# Patient Record
Sex: Female | Born: 1965 | Hispanic: No | Marital: Single | Smoking: Former smoker
Health system: Southern US, Community
[De-identification: ages and names within clinical notes are randomized; demographics above are authoritative.]

## PROBLEM LIST (undated history)

## (undated) DIAGNOSIS — E119 Type 2 diabetes mellitus without complications: Secondary | ICD-10-CM

## (undated) DIAGNOSIS — J40 Bronchitis, not specified as acute or chronic: Secondary | ICD-10-CM

## (undated) HISTORY — PX: ABDOMINAL SURGERY: SHX537

## (undated) HISTORY — PX: APPENDECTOMY: SHX54

## (undated) HISTORY — PX: TUBAL LIGATION: SHX77

---

## 2003-09-18 ENCOUNTER — Emergency Department (HOSPITAL_COMMUNITY): Admission: EM | Admit: 2003-09-18 | Discharge: 2003-09-18 | Payer: Self-pay

## 2009-02-24 ENCOUNTER — Emergency Department (HOSPITAL_COMMUNITY): Admission: EM | Admit: 2009-02-24 | Discharge: 2009-02-25 | Payer: Self-pay | Admitting: Emergency Medicine

## 2009-02-24 ENCOUNTER — Emergency Department (HOSPITAL_COMMUNITY): Admission: EM | Admit: 2009-02-24 | Discharge: 2009-02-24 | Payer: Self-pay | Admitting: Emergency Medicine

## 2010-11-15 LAB — DIFFERENTIAL
Basophils Absolute: 0 10*3/uL (ref 0.0–0.1)
Basophils Relative: 0 % (ref 0–1)
Monocytes Absolute: 0.6 10*3/uL (ref 0.1–1.0)
Neutro Abs: 6.7 10*3/uL (ref 1.7–7.7)
Neutrophils Relative %: 62 % (ref 43–77)

## 2010-11-15 LAB — URINE CULTURE

## 2010-11-15 LAB — URINALYSIS, ROUTINE W REFLEX MICROSCOPIC
Bilirubin Urine: NEGATIVE
Ketones, ur: NEGATIVE mg/dL
Nitrite: NEGATIVE
Urobilinogen, UA: 1 mg/dL (ref 0.0–1.0)

## 2010-11-15 LAB — COMPREHENSIVE METABOLIC PANEL
Albumin: 4 g/dL (ref 3.5–5.2)
Alkaline Phosphatase: 76 U/L (ref 39–117)
BUN: 12 mg/dL (ref 6–23)
CO2: 27 mEq/L (ref 19–32)
Chloride: 106 mEq/L (ref 96–112)
Glucose, Bld: 94 mg/dL (ref 70–99)
Potassium: 3.6 mEq/L (ref 3.5–5.1)
Total Bilirubin: 0.4 mg/dL (ref 0.3–1.2)

## 2010-11-15 LAB — CBC
HCT: 30.3 % — ABNORMAL LOW (ref 36.0–46.0)
Hemoglobin: 10 g/dL — ABNORMAL LOW (ref 12.0–15.0)
RBC: 3.86 MIL/uL — ABNORMAL LOW (ref 3.87–5.11)
WBC: 10.8 10*3/uL — ABNORMAL HIGH (ref 4.0–10.5)

## 2010-11-15 LAB — POCT URINALYSIS DIP (DEVICE)
Bilirubin Urine: NEGATIVE
Glucose, UA: NEGATIVE mg/dL
Hgb urine dipstick: NEGATIVE
Specific Gravity, Urine: >=1.03 (ref 1.005–1.030)
Urobilinogen, UA: 0.2 mg/dL (ref 0.0–1.0)
pH: 5.5 (ref 5.0–8.0)

## 2010-11-15 LAB — WET PREP, GENITAL: Trich, Wet Prep: NONE SEEN

## 2010-11-15 LAB — GC/CHLAMYDIA PROBE AMP, GENITAL: Chlamydia, DNA Probe: NEGATIVE

## 2010-11-15 LAB — PREGNANCY, URINE: Preg Test, Ur: NEGATIVE

## 2010-12-22 NOTE — Consult Note (Signed)
Christina Horton, GALDAMEZ NO.:  [id number]   MEDICAL RECORD NO.:  [id number]          PATIENT TYPE:  EMS   LOCATION:  [facility]                         FACILITY:  [facility]   PHYSICIAN:  Juanetta Gosling, MDDATE OF BIRTH:  08/21/1965   DATE OF CONSULTATION:  DATE OF DISCHARGE:  02/25/2009                                 CONSULTATION   CHIEF COMPLAINT:  Abdominal pain.   CONSULTING PHYSICIAN:  April Palumbo-Rasch, MD.   HISTORY OF PRESENT ILLNESS:  This is a 45 year old female with over 36-  year history of mid right-sided abdominal pain that began with an  insidious onset.  It has worsened over that time.  She came in tonight  just because it had been getting worse.  She denies any fevers.  Denies  nausea, vomiting.  She has had normal bowel movement.  She says she has  been eating today.  There is no real relieving or aggravating factors  that she can associate with this pain.   PAST SURGICAL HISTORY:  Includes an appendectomy, abdominoplasty and  bilateral tubal ligation.   PAST MEDICAL HISTORY:  Negative.   MEDICATIONS:  None.   ALLERGIES:  She has no known drug allergies.   SOCIAL HISTORY:  She is a smoker and does drink occasional alcohol.   REVIEW OF SYSTEMS:  Otherwise negative.   PHYSICAL EXAMINATION:  VITAL SIGNS:  Temperature 97.1, pulse 73, blood  pressure 132/84, respirations 16, O2 sats 100%.  GENERAL:  She is an obese female in no apparent distress.  HEART:  Regular rate and rhythm.  LUNGS:  Clear bilaterally.  ABDOMEN:  Soft, obese.  She has a transverse scar present.  She has some  tenderness to palpation on her right mid abdomen, but no peritoneal  signs.  Bowel sounds are present.  EXTREMITIES:  No edema.   LABORATORY EVALUATION:  White blood cell count of 10.8 with normal  neutrophil count, hematocrit of 30.3 and platelets of 376.  Urinalysis  shows large leukocyte esterase and 21-50 white blood cells.  Her sodium  is 140, potassium 3.6,  glucose 94, BUN 12, creatinine 0.70.  Liver  function test are otherwise normal.  Lipase is 24.  CT scan of her  abdomen and pelvis shows a focal inflammatory process, infiltrative  change laterally, ascending colon appears to be in the omentum or  mesenteric fat.  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   PLAN:  There was no evidence that she has any need for surgical  treatment of her abdomen currently.  I do not think that she can  probably be discharged home with some warning that I gave her with some  close followup with her primary care physician.  I did talk to her  extensively about following up in case of any worrisome symptoms  including fevers, nausea, vomiting, worsening pain and inability to pass  gas or have any bowel movements.  She understands this, and I think she  would be reliable.  Dr. Nicanor Alcon is going to treat her, also for a  urinary tract infection which she apparently has on top of all this as  well.      Juanetta Gosling, MD  Electronically Signed     MCW/MEDQ  D:  02/24/2009  T:  02/25/2009  Job:  [id number]

## 2012-02-18 ENCOUNTER — Emergency Department (HOSPITAL_COMMUNITY)
Admission: EM | Admit: 2012-02-18 | Discharge: 2012-02-18 | Disposition: A | Discharge status: Home / Self Care | Payer: BC Managed Care – PPO | Attending: Emergency Medicine | Admitting: Emergency Medicine

## 2012-02-18 ENCOUNTER — Encounter (HOSPITAL_COMMUNITY): Payer: Self-pay | Admitting: *Deleted

## 2012-02-18 DIAGNOSIS — B9789 Other viral agents as the cause of diseases classified elsewhere: Secondary | ICD-10-CM | POA: Insufficient documentation

## 2012-02-18 DIAGNOSIS — R599 Enlarged lymph nodes, unspecified: Secondary | ICD-10-CM | POA: Insufficient documentation

## 2012-02-18 DIAGNOSIS — J029 Acute pharyngitis, unspecified: Secondary | ICD-10-CM | POA: Insufficient documentation

## 2012-02-18 DIAGNOSIS — F17200 Nicotine dependence, unspecified, uncomplicated: Secondary | ICD-10-CM | POA: Insufficient documentation

## 2012-02-18 MED ORDER — HYDROCODONE-ACETAMINOPHEN 5-325 MG PO TABS: 1.0000 | ORAL_TABLET | Freq: Four times a day (QID) | ORAL | Status: AC | PRN

## 2012-02-18 NOTE — ED Notes (Signed)
Pt reports symptoms started on July 5th with cough and URI.  Pt reports she has a history of bronchitis and stopped smoking, but smoked cigarettes on the 4th and she believes this may have started her symptoms.  Pt reports cough has improved, but now has a sore throat.

## 2012-02-18 NOTE — ED Provider Notes (Signed)
History     CSN: [id number]  Arrival date & time 02/18/12  1012   First MD Initiated Contact with Patient 02/18/12 1030      Chief Complaint  Patient presents with  . Sore Throat    (Consider location/radiation/quality/duration/timing/severity/associated sxs/prior treatment) HPI Comments: Patient with a significant past medical history presents emergency department with chief complaint of sore throat.  Onset of symptoms began last night and are described as pain with swallowing.  Patient denies any inability to swallow secretions or food.  In addition patient reports that she had a nonproductive cough back on July 5 and smoked some cigarettes that weekend as well.  She denies fevers, night sweats, chills, current cough, hemoptysis, congestion.  No other complaints at this time.  The history is provided by the patient.    History reviewed. No pertinent past medical history.  Past Surgical History  Procedure Date  . Abdominal surgery   . Tubal ligation     No family history on file.  History  Substance Use Topics  . Smoking status: Current Some Day Smoker  . Smokeless tobacco: Not on file  . Alcohol Use: Yes     socially    OB History    Grav Para Term Preterm Abortions TAB SAB Ect Mult Living                  Review of Systems  Constitutional: Negative for fever, chills, diaphoresis, activity change, appetite change and fatigue.  HENT: Positive for sore throat. Negative for congestion, facial swelling, rhinorrhea, sneezing, drooling, trouble swallowing, neck pain, neck stiffness, dental problem, voice change, postnasal drip and sinus pressure.   Eyes: Negative for visual disturbance.  Respiratory: Negative for cough, choking, shortness of breath, wheezing and stridor.   Cardiovascular: Negative for chest pain.  Gastrointestinal: Negative for abdominal pain.  Skin: Negative for rash.  Neurological: Negative for dizziness and headaches.  Hematological: Negative for  adenopathy. Does not bruise/bleed easily.    Allergies  Review of patient's allergies indicates no known allergies.  Home Medications  No current outpatient prescriptions on file.  BP 144/88  Pulse 90  Temp 98.4 F (36.9 C) (Oral)  Resp 18  SpO2 97%  LMP 01/30/2012  Physical Exam  Nursing note and vitals reviewed. Constitutional: She is oriented to person, place, and time. She appears well-developed and well-nourished. No distress.  HENT:  Head: Normocephalic and atraumatic. No trismus in the jaw.  Right Ear: Tympanic membrane, external ear and ear canal normal.  Left Ear: Tympanic membrane, external ear and ear canal normal.  Nose: Nose normal. No rhinorrhea. Right sinus exhibits no maxillary sinus tenderness and no frontal sinus tenderness. Left sinus exhibits no maxillary sinus tenderness and no frontal sinus tenderness.  Mouth/Throat: Uvula is midline and mucous membranes are normal. Normal dentition. No dental abscesses or uvula swelling. No oropharyngeal exudate, posterior oropharyngeal edema, posterior oropharyngeal erythema or tonsillar abscesses.       No submental edema, tongue not elevated, no trismus. No impending airway obstruction; Pt able to speak full sentences, swallow intact, no drooling, stridor, or tonsillar/uvula displacement. No palatal petechia  Eyes: Conjunctivae are normal.  Neck: Trachea normal, normal range of motion and full passive range of motion without pain. Neck supple. No rigidity. Erythema present. Normal range of motion present. No Brudzinski's sign noted.       Flexion and extension of neck without pain or difficulty. Able to breath without difficulty in extension.  Cardiovascular: Normal rate and  regular rhythm.   Pulmonary/Chest: Effort normal and breath sounds normal. No stridor. No respiratory distress. She has no wheezes.  Abdominal: Soft. There is no tenderness.       No obvious evidence of splenomegaly. Non ttp.   Musculoskeletal: Normal  range of motion.  Lymphadenopathy:       Head (right side): No preauricular and no posterior auricular adenopathy present.       Head (left side): No preauricular and no posterior auricular adenopathy present.    She has cervical adenopathy.  Neurological: She is alert and oriented to person, place, and time.  Skin: Skin is warm and dry. No rash noted. She is not diaphoretic.  Psychiatric: She has a normal mood and affect.    ED Course  Procedures (including critical care time)   Labs Reviewed  RAPID STREP SCREEN   No results found.   No diagnosis found.    MDM  Pt afebrile without tonsillar exudate, negative strep. Presents with mild cervical lymphadenopathy, & dysphagia; diagnosis of viral pharyngitis. No abx indicated. DC w symptomatic tx for pain  Pt does not appear dehydrated, but did discuss importance of water rehydration. Presentation non concerning for PTA or infxn spread to soft tissue. No trismus or uvula deviation. Specific return precautions discussed. Pt able to drink water in ED without difficulty with intact air way. Recommended PCP follow up.            Jaci Carrel, [place] 02/18/12 1114

## 2012-02-20 NOTE — ED Provider Notes (Signed)
Medical screening examination/treatment/procedure(s) were performed by non-physician practitioner and as supervising physician I was immediately available for consultation/collaboration.  Toy Baker, MD 02/20/12 [phone]

## 2012-05-10 ENCOUNTER — Emergency Department (INDEPENDENT_AMBULATORY_CARE_PROVIDER_SITE_OTHER): Payer: BC Managed Care – PPO

## 2012-05-10 ENCOUNTER — Emergency Department (HOSPITAL_COMMUNITY)
Admission: EM | Admit: 2012-05-10 | Discharge: 2012-05-10 | Disposition: A | Discharge status: Home / Self Care | Payer: BC Managed Care – PPO | Source: Home / Self Care | Attending: Emergency Medicine | Admitting: Emergency Medicine

## 2012-05-10 ENCOUNTER — Encounter (HOSPITAL_COMMUNITY): Payer: Self-pay | Admitting: *Deleted

## 2012-05-10 DIAGNOSIS — IMO0002 Reserved for concepts with insufficient information to code with codable children: Secondary | ICD-10-CM

## 2012-05-10 DIAGNOSIS — M5416 Radiculopathy, lumbar region: Secondary | ICD-10-CM

## 2012-05-10 LAB — POCT URINALYSIS DIP (DEVICE)
Bilirubin Urine: NEGATIVE
Ketones, ur: NEGATIVE mg/dL
Leukocytes, UA: NEGATIVE
pH: 6 (ref 5.0–8.0)

## 2012-05-10 LAB — POCT PREGNANCY, URINE: Preg Test, Ur: NEGATIVE

## 2012-05-10 MED ORDER — CYCLOBENZAPRINE HCL 10 MG PO TABS: 10.0000 mg | ORAL_TABLET | Freq: Two times a day (BID) | ORAL | Status: AC | PRN

## 2012-05-10 MED ORDER — HYDROCODONE-IBUPROFEN 7.5-200 MG PO TABS: 1.0000 | ORAL_TABLET | Freq: Three times a day (TID) | ORAL | Status: AC | PRN

## 2012-05-10 NOTE — ED Provider Notes (Signed)
History     CSN: [id number]  Arrival date & time 05/10/12  1204   First MD Initiated Contact with Patient 05/10/12 1218      Chief Complaint  Patient presents with  . Flank Pain    (Consider location/radiation/quality/duration/timing/severity/associated sxs/prior treatment) HPI Comments: Patient reports that for about 9 days she's been having left-sided lower back pain that radiates down toward the interior as of her left leg. Describes that the day prior she was lifting weights in a gym and did feel that the pain started today after. Denies any specific injuries or falls does to relate that all started after that particular exercise session. Describes the pain starts in the lower left side of her back that radiates towards the anterior aspect of her thigh to the anterior aspect of her thigh. Feels some occasional tingling sensation in the anterior aspect of her left leg. Denies any weakness of her lower extremity, any urinary symptoms such as increased frequency, loss of control or burning. Patient also describes no changes in her bowel habits. He denies any constitutional symptoms such as fevers, malaise, or unintentional weight loss.  Patient is a 46 y.o. female presenting with flank pain. The history is provided by the patient.  Flank Pain This is a new problem. The current episode started more than 1 week ago. The problem occurs constantly. The problem has been gradually worsening. Pertinent negatives include no abdominal pain. The symptoms are aggravated by walking, twisting and bending. The symptoms are relieved by NSAIDs, lying down, heat and ice. She has tried a warm compress, acetaminophen, rest and a cold compress for the symptoms. The treatment provided mild relief.    History reviewed. No pertinent past medical history.  Past Surgical History  Procedure Date  . Abdominal surgery   . Tubal ligation     No family history on file.  History  Substance Use Topics  . Smoking  status: Current Some Day Smoker  . Smokeless tobacco: Not on file  . Alcohol Use: Yes     socially    OB History    Grav Para Term Preterm Abortions TAB SAB Ect Mult Living                  Review of Systems  Constitutional: Positive for activity change. Negative for fever, chills, appetite change, fatigue and unexpected weight change.  Cardiovascular: Negative for leg swelling.  Gastrointestinal: Negative for abdominal pain.  Genitourinary: Positive for flank pain. Negative for dysuria, urgency, frequency, hematuria, decreased urine volume, difficulty urinating and dyspareunia.  Musculoskeletal: Positive for back pain and joint swelling. Negative for arthralgias.  Skin: Negative for rash.  Neurological: Positive for numbness. Negative for weakness.    Allergies  Review of patient's allergies indicates no known allergies.  Home Medications   Current Outpatient Rx  Name Route Sig Dispense Refill  . CYCLOBENZAPRINE HCL 10 MG PO TABS Oral Take 1 tablet (10 mg total) by mouth 2 (two) times daily as needed for muscle spasms. 20 tablet 0  . HYDROCODONE-IBUPROFEN 7.5-200 MG PO TABS Oral Take 1 tablet by mouth every 8 (eight) hours as needed for pain. 30 tablet 0    BP 139/100  Pulse 96  Temp 98 F (36.7 C) (Oral)  Resp 20  SpO2 99%  LMP 05/01/2012  Physical Exam  Nursing note and vitals reviewed. Constitutional: She appears well-developed and well-nourished.  Cardiovascular: Normal rate.  Exam reveals no gallop and no friction rub.   No murmur heard.  Abdominal: Soft.  Musculoskeletal: She exhibits tenderness.       Lumbar back: She exhibits decreased range of motion, tenderness, bony tenderness and pain. She exhibits no swelling, no laceration, no spasm and normal pulse.       Back:  Neurological: She is alert. No cranial nerve deficit.  Skin: Skin is warm. No erythema.    ED Course  Procedures (including critical care time)   Labs Reviewed  POCT URINALYSIS DIP  (DEVICE)  POCT PREGNANCY, URINE   Dg Lumbar Spine Complete  05/10/2012  *RADIOLOGY REPORT*  Clinical Data: Injured lifting weights, low back pain  LUMBAR SPINE - COMPLETE 4+ VIEW  Comparison: CT abdomen of 02/24/2009  Findings: The lumbar vertebrae are in normal alignment.  There is slight loss of disc space at L5-S1 with minimal spurring.  No fracture is seen.  The remainder of intervertebral disc spaces appear normal.  The SI joints are well corticated.  IMPRESSION: Normal alignment.  Very mild degenerative disc disease at L5-S1.   Original Report Authenticated By: Juline Patch, M.D.      1. Lumbar radiculopathy       MDM  Patient has been symptomatic for approximately 10 days. Vicoprofen prescribed along with flexor. Encouraged to followup with orthopedic Dr. Stanford Breed the noted mild DJD at L5-S1. Exam was not revealing for a muscular deficit. Patient was encouraged to followup with orthopedic doctor pain was to persist after 1-2 weeks to be consider physical therapy or further evaluation. She agrees to pursue further evaluation if symptoms were to persist. Patient was also encouraged not to use over-the-counter medicines while taking this medicines. Otherwise about potential side effects including drowsiness and dizziness. She understands and agrees with treatment plan and followup care as necessary.      Jimmie Molly, MD 05/10/12 [phone]

## 2012-05-10 NOTE — ED Notes (Signed)
Pt  Reports       l  Side  Pain  Flank  Area   With  Radiation  Down l   Leg   X  9  Days   Se[facility]  [facility]    Was  rx  Flexeril  / pain pill  And  Given  steriod  Shot      -  Pt  Reports  Still  Having  Symptoms

## 2012-10-26 ENCOUNTER — Ambulatory Visit (INDEPENDENT_AMBULATORY_CARE_PROVIDER_SITE_OTHER): Payer: BC Managed Care – PPO | Admitting: Family Medicine

## 2012-10-26 ENCOUNTER — Ambulatory Visit: Payer: BC Managed Care – PPO

## 2012-10-26 VITALS — BP 132/88 | HR 100 | Temp 98.5°F | Resp 16 | Ht 62.0 in | Wt 246.0 lb

## 2012-10-26 DIAGNOSIS — M722 Plantar fascial fibromatosis: Secondary | ICD-10-CM

## 2012-10-26 DIAGNOSIS — M7750 Other enthesopathy of unspecified foot: Secondary | ICD-10-CM

## 2012-10-26 DIAGNOSIS — M79609 Pain in unspecified limb: Secondary | ICD-10-CM

## 2012-10-26 DIAGNOSIS — M659 Synovitis and tenosynovitis, unspecified: Secondary | ICD-10-CM

## 2012-10-26 DIAGNOSIS — M79671 Pain in right foot: Secondary | ICD-10-CM

## 2012-10-26 MED ORDER — TRAMADOL HCL 50 MG PO TABS: ORAL_TABLET | ORAL | Status: DC

## 2012-10-26 MED ORDER — DICLOFENAC SODIUM 75 MG PO TBEC: DELAYED_RELEASE_TABLET | ORAL | Status: DC

## 2012-10-26 NOTE — Patient Instructions (Addendum)
Diclofenac 75 mg one twice daily  Tramadol 50 mg every 6 hours as needed for severe pain  Wear splint  Use crutches  Ice the foot about 5 or 6 times daily  Return on Tuesday the 25th between 8 and 12 AM

## 2012-10-26 NOTE — Progress Notes (Signed)
Subjective: 47 year old lady who has been having progressive worsening of right heel pain over the past month or so. Knows of no specific injuries. 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 She wears comfortable shoes, and changes them often. The pain is in the medial aspect of the right foot. She says it is like it's going to cramp up into the calf of the legs. The pain hurts just anterior to the medial malleolus and on down to the anterior aspect of the calcaneus.  She is generally a healthy lady. Last menstrual cycle was started and she is pregnancy at this time.  Objective: No obvious swelling of her foot or ankle. She has tenderness in the right ankle down the medial aspect following a path running just anterior to the medial malleolus, all the way around to under the foot at the anterior aspect of the calcaneus. Flexion and extension of the ankle did not cause a great deal of pain. Eversion of the ankle is very painful. Inversion less. Point tenderness along the whole band, getting worse from below the malleolus to be anterior calcaneus area. There is no erythema. She can move her toes fine. Pulses are good.  Assessment: Ankle and foot pain, suspicious for tendinitis/plantar fasciitis  Plan: Get x-ray of her ankle and proceed from there  [facility] reading (PRIMARY) by  Dr. Alwyn Ren No fracture  Assessment: Treat with inflammatory medications and pain medication, as well as splint.  Use crutches when up and about. Minimize going up and downstairs. He has been advised to keep on hurting for a long time, so I'm going to leave her off work for 5 days See her back on Tuesday. Diclofenac and tramadol.  2

## 2012-10-31 ENCOUNTER — Ambulatory Visit (INDEPENDENT_AMBULATORY_CARE_PROVIDER_SITE_OTHER): Payer: BC Managed Care – PPO | Admitting: Family Medicine

## 2012-10-31 VITALS — BP 134/84 | HR 98 | Temp 98.5°F | Resp 18 | Ht 62.0 in | Wt 247.0 lb

## 2012-10-31 DIAGNOSIS — M659 Synovitis and tenosynovitis, unspecified: Secondary | ICD-10-CM

## 2012-10-31 DIAGNOSIS — M722 Plantar fascial fibromatosis: Secondary | ICD-10-CM

## 2012-10-31 DIAGNOSIS — M7750 Other enthesopathy of unspecified foot: Secondary | ICD-10-CM

## 2012-10-31 DIAGNOSIS — Z0271 Encounter for disability determination: Secondary | ICD-10-CM

## 2012-10-31 DIAGNOSIS — M79671 Pain in right foot: Secondary | ICD-10-CM

## 2012-10-31 MED ORDER — DICLOFENAC SODIUM 75 MG PO TBEC: DELAYED_RELEASE_TABLET | ORAL | Status: DC

## 2012-10-31 MED ORDER — TRAMADOL HCL 50 MG PO TABS: ORAL_TABLET | ORAL | Status: DC

## 2012-10-31 NOTE — Patient Instructions (Signed)
Wear the Cam Walker.  Continue to try and limit use of the foot.  Bring by the [facility] papers  We will make a referral for further evaluation of the foot.

## 2012-10-31 NOTE — Progress Notes (Signed)
Subjective: Patient is here for recheck of her foot. It continues to hurt. She has been trying to rest it very strictly for the last 5 days. She works a job where she has to stand for about 10 hours. She says there is no light duty alternative. She walks back and forth all day. She has been wearing the splint though it causes her some ankle discomfort.  Objective: Pleasant lady who continues to have foot discomfort. Still quite tender over the medial tendon insertions to the lateral aspect of the calcaneus, as well as in the anterior calcaneal region.  Assessment: Plantar fasciitis Tendinitis right foot  Plan: Continue current medications Try a Cam Walker as well as using the crutches as needed.  Make referral to sports medicine, Dr. Karel Jarvis, for further evaluation and treatment.  The patient will bring by her FMLA papers.

## 2013-01-17 ENCOUNTER — Emergency Department (HOSPITAL_COMMUNITY)
Admission: EM | Admit: 2013-01-17 | Discharge: 2013-01-17 | Disposition: A | Discharge status: Home / Self Care | Payer: BC Managed Care – PPO | Source: Home / Self Care

## 2013-01-17 ENCOUNTER — Encounter (HOSPITAL_COMMUNITY): Payer: Self-pay | Admitting: Emergency Medicine

## 2013-01-17 DIAGNOSIS — R1011 Right upper quadrant pain: Secondary | ICD-10-CM

## 2013-01-17 LAB — CBC WITH DIFFERENTIAL/PLATELET
Basophils Absolute: 0 10*3/uL (ref 0.0–0.1)
Basophils Relative: 0 % (ref 0–1)
Eosinophils Absolute: 0.2 10*3/uL (ref 0.0–0.7)
Eosinophils Relative: 2 % (ref 0–5)
MCH: 27.8 pg (ref 26.0–34.0)
MCV: 84.3 fL (ref 78.0–100.0)
Neutrophils Relative %: 68 % (ref 43–77)
Platelets: 307 10*3/uL (ref 150–400)
RBC: 4.28 MIL/uL (ref 3.87–5.11)
RDW: 14 % (ref 11.5–15.5)

## 2013-01-17 LAB — COMPREHENSIVE METABOLIC PANEL
ALT: 95 U/L — ABNORMAL HIGH (ref 0–35)
AST: 86 U/L — ABNORMAL HIGH (ref 0–37)
Albumin: 3.6 g/dL (ref 3.5–5.2)
Alkaline Phosphatase: 103 U/L (ref 39–117)
Calcium: 9.1 mg/dL (ref 8.4–10.5)
GFR calc Af Amer: >90 mL/min (ref >90)
Potassium: 4 mEq/L (ref 3.5–5.1)
Sodium: 139 mEq/L (ref 135–145)
Total Protein: 7.4 g/dL (ref 6.0–8.3)

## 2013-01-17 MED ORDER — OMEPRAZOLE 40 MG PO CPDR: 40.0000 mg | DELAYED_RELEASE_CAPSULE | Freq: Every day | ORAL | Status: DC

## 2013-01-17 NOTE — ED Notes (Signed)
Pt c/o intermittent RUQ pain onset 4 months\ Reports its gradually swelling... Pain increases w/alcahol, food intake and when pressure is applied to it Denies: f/v/n/d, constipation... She is alert and oriented w/no signs of acute distress.

## 2013-01-17 NOTE — ED Provider Notes (Signed)
History     CSN: [id number]  Arrival date & time 01/17/13  1124   First MD Initiated Contact with Patient 01/17/13 1204      Chief Complaint  Patient presents with  . Abdominal Pain    (Consider location/radiation/quality/duration/timing/severity/associated sxs/prior treatment) Patient is a 47 y.o. female presenting with abdominal pain.  Abdominal Pain Associated symptoms include abdominal pain.   This is a 47 year old female who presents with a complaint of right upper quadrant "burning" pain often related to eating and drinking. She states that it has been going on for about 4 months now and usually lasts for no more than a few hours after she eats. This is usually associated with nausea but no vomiting. She has not had any weight loss. She has not had any fevers. Pain is mostly in right upper quadrant and not at all in the epigastrium. No history of reflux. History reviewed. No pertinent past medical history.  Past Surgical History  Procedure Laterality Date  . Abdominal surgery    . Tubal ligation    . Appendectomy      Family History  Problem Relation Age of Onset  . Cancer Sister     History  Substance Use Topics  . Smoking status: Current Some Day Smoker  . Smokeless tobacco: Not on file  . Alcohol Use: Yes     Comment: socially    OB History   Grav Para Term Preterm Abortions TAB SAB Ect Mult Living                  Review of Systems  Constitutional: Negative.  Negative for fever and appetite change.  HENT: Negative.   Eyes: Negative.   Respiratory: Negative.   Cardiovascular: Negative.   Gastrointestinal: Positive for nausea, abdominal pain and abdominal distention. Negative for vomiting, constipation, blood in stool and anal bleeding.  Genitourinary: Negative.   Skin: Negative.   Neurological: Negative.   Hematological: Negative.   Psychiatric/Behavioral: Negative.     Allergies  Review of patient's allergies indicates no known  allergies.  Home Medications   Current Outpatient Rx  Name  Route  Sig  Dispense  Refill  . diclofenac (VOLTAREN) 75 MG EC tablet      Take one twice daily with food for inflammation and pain. Take on regular basis.   30 tablet   0   . omeprazole (PRILOSEC) 40 MG capsule   Oral   Take 1 capsule (40 mg total) by mouth daily.   30 capsule   0   . traMADol (ULTRAM) 50 MG tablet      Take one every 6 hours as needed for severe pain only   15 tablet   0     BP 107/69  Pulse 86  Temp(Src) 98.5 F (36.9 C) (Oral)  Resp 16  SpO2 98%  LMP 01/07/2013  Physical Exam  Constitutional: She is oriented to person, place, and time. She appears well-developed and well-nourished.  HENT:  Head: Normocephalic and atraumatic.  Eyes: Conjunctivae are normal. Pupils are equal, round, and reactive to light.  Neck: Normal range of motion. Neck supple.  Cardiovascular: Normal rate and regular rhythm.   Pulmonary/Chest: Effort normal and breath sounds normal.  Abdominal: Soft. Bowel sounds are normal. She exhibits no distension and no mass. There is tenderness. There is no rebound and no guarding.  Musculoskeletal: Normal range of motion.  Neurological: She is alert and oriented to person, place, and time.  Skin: Skin is warm  and dry.  Psychiatric: She has a normal mood and affect. Her behavior is normal.    ED Course  Procedures (including critical care time)  Labs Reviewed - No data to display No results found.   1. RUQ abdominal pain       MDM  Ultrasound of liver/gallblader and CBC/Cmet ordered- she can follow up here or with her PCP for results. In case this is simply a gastritis, I will start treatment with Omeprazole 40 mg daily.         Calvert Cantor, MD 01/17/13 1242

## 2013-01-17 NOTE — ED Notes (Signed)
Pt has been scheduled appt for [place] of abdomen on 01/22/13 at 0745 for check in Also, Rx w/lab orders has been given to pt along w/US orders .... Pt has verbalized understanding

## 2013-01-22 ENCOUNTER — Ambulatory Visit (HOSPITAL_COMMUNITY)
Admit: 2013-01-22 | Discharge: 2013-01-22 | Disposition: A | Discharge status: Home / Self Care | Payer: BC Managed Care – PPO | Attending: Internal Medicine | Admitting: Internal Medicine

## 2013-01-22 ENCOUNTER — Other Ambulatory Visit (HOSPITAL_COMMUNITY): Payer: Self-pay | Admitting: Internal Medicine

## 2013-01-22 DIAGNOSIS — R109 Unspecified abdominal pain: Secondary | ICD-10-CM

## 2013-01-22 DIAGNOSIS — R1011 Right upper quadrant pain: Secondary | ICD-10-CM | POA: Insufficient documentation

## 2013-01-29 ENCOUNTER — Emergency Department (HOSPITAL_COMMUNITY): Payer: BC Managed Care – PPO

## 2013-01-29 ENCOUNTER — Encounter (HOSPITAL_COMMUNITY): Payer: Self-pay | Admitting: *Deleted

## 2013-01-29 ENCOUNTER — Emergency Department (HOSPITAL_COMMUNITY)
Admission: EM | Admit: 2013-01-29 | Discharge: 2013-01-29 | Disposition: A | Discharge status: Home / Self Care | Payer: BC Managed Care – PPO | Attending: Emergency Medicine | Admitting: Emergency Medicine

## 2013-01-29 DIAGNOSIS — R109 Unspecified abdominal pain: Secondary | ICD-10-CM

## 2013-01-29 DIAGNOSIS — K6389 Other specified diseases of intestine: Secondary | ICD-10-CM | POA: Insufficient documentation

## 2013-01-29 DIAGNOSIS — Z87891 Personal history of nicotine dependence: Secondary | ICD-10-CM | POA: Insufficient documentation

## 2013-01-29 LAB — LIPASE, BLOOD: Lipase: 33 U/L (ref 11–59)

## 2013-01-29 LAB — URINALYSIS, ROUTINE W REFLEX MICROSCOPIC
Glucose, UA: NEGATIVE mg/dL
Hgb urine dipstick: NEGATIVE
Protein, ur: NEGATIVE mg/dL
pH: 6 (ref 5.0–8.0)

## 2013-01-29 LAB — COMPREHENSIVE METABOLIC PANEL
ALT: 55 U/L — ABNORMAL HIGH (ref 0–35)
Alkaline Phosphatase: 99 U/L (ref 39–117)
BUN: 9 mg/dL (ref 6–23)
CO2: 27 mEq/L (ref 19–32)
Chloride: 102 mEq/L (ref 96–112)
GFR calc Af Amer: >90 mL/min (ref >90)
Glucose, Bld: 155 mg/dL — ABNORMAL HIGH (ref 70–99)
Potassium: 3.9 mEq/L (ref 3.5–5.1)
Sodium: 136 mEq/L (ref 135–145)
Total Bilirubin: 0.3 mg/dL (ref 0.3–1.2)

## 2013-01-29 LAB — POCT PREGNANCY, URINE: Preg Test, Ur: NEGATIVE

## 2013-01-29 LAB — URINE MICROSCOPIC-ADD ON

## 2013-01-29 LAB — CBC
HCT: 38.5 % (ref 36.0–46.0)
Hemoglobin: 12.6 g/dL (ref 12.0–15.0)
RBC: 4.57 MIL/uL (ref 3.87–5.11)
WBC: 8.5 10*3/uL (ref 4.0–10.5)

## 2013-01-29 MED ORDER — IBUPROFEN 600 MG PO TABS: 600.0000 mg | ORAL_TABLET | Freq: Three times a day (TID) | ORAL | Status: DC | PRN

## 2013-01-29 MED ORDER — IOHEXOL 300 MG/ML  SOLN
25.0000 mL | INTRAMUSCULAR | Status: AC
  Administered 2013-01-29: 13:00:00 via ORAL

## 2013-01-29 MED ORDER — IBUPROFEN 400 MG PO TABS
400.0000 mg | ORAL_TABLET | Freq: Once | ORAL | Status: AC
  Administered 2013-01-29: 400 mg via ORAL
  Filled 2013-01-29: qty 1

## 2013-01-29 MED ORDER — IOHEXOL 300 MG/ML  SOLN
100.0000 mL | Freq: Once | INTRAMUSCULAR | Status: AC | PRN
  Administered 2013-01-29: 100 mL via INTRAVENOUS

## 2013-01-29 NOTE — ED Notes (Signed)
Pain with urination

## 2013-01-29 NOTE — ED Provider Notes (Signed)
History     CSN: [id number]  Arrival date & time 01/29/13  1031   First MD Initiated Contact with Patient 01/29/13 1220      Chief Complaint  Patient presents with  . Abdominal Pain    (Consider location/radiation/quality/duration/timing/severity/associated sxs/prior treatment) Patient is a 47 y.o. female presenting with abdominal pain. The history is provided by the patient.  Abdominal Pain Associated symptoms include abdominal pain. Pertinent negatives include no chest pain, no headaches and no shortness of breath.  pt c/o lower abdominal pain for the past couple days. Gradual onset. Constant. Dull. Non radiating. Moderate. Denies specific exacerbating or alleviating factors. No associated nvd, fever or chills. No dysuria or gu c/o. No back or flank pain. Prior abd surgery includes tubal ligation and appendectomy. No hx pud or gallstones. lnmp 2 weeks ago, norma.no denies vaginal discharge or bleeding.     History reviewed. No pertinent past medical history.  Past Surgical History  Procedure Laterality Date  . Abdominal surgery    . Tubal ligation    . Appendectomy      Family History  Problem Relation Age of Onset  . Cancer Sister     History  Substance Use Topics  . Smoking status: Former Games developer  . Smokeless tobacco: Not on file  . Alcohol Use: Yes     Comment: socially    OB History   Grav Para Term Preterm Abortions TAB SAB Ect Mult Living                  Review of Systems  Constitutional: Negative for fever and chills.  HENT: Negative for neck pain.   Eyes: Negative for redness.  Respiratory: Negative for shortness of breath.   Cardiovascular: Negative for chest pain.  Gastrointestinal: Positive for abdominal pain. Negative for vomiting, diarrhea and constipation.  Genitourinary: Negative for dysuria, flank pain, vaginal bleeding and vaginal discharge.  Musculoskeletal: Negative for back pain.  Skin: Negative for rash.  Neurological: Negative for  headaches.  Hematological: Does not bruise/bleed easily.  Psychiatric/Behavioral: Negative for confusion.    Allergies  Review of patient's allergies indicates no known allergies.  Home Medications   Current Outpatient Rx  Name  Route  Sig  Dispense  Refill  . diclofenac (VOLTAREN) 75 MG EC tablet      Take one twice daily with food for inflammation and pain. Take on regular basis.   30 tablet   0   . omeprazole (PRILOSEC) 40 MG capsule   Oral   Take 1 capsule (40 mg total) by mouth daily.   30 capsule   0   . traMADol (ULTRAM) 50 MG tablet      Take one every 6 hours as needed for severe pain only   15 tablet   0     BP 115/68  Pulse 77  Temp(Src) 98 F (36.7 C) (Oral)  Resp 20  SpO2 96%  LMP 01/07/2013  Physical Exam  Nursing note and vitals reviewed. Constitutional: She appears well-developed and well-nourished. No distress.  HENT:  Mouth/Throat: Oropharynx is clear and moist.  Eyes: Conjunctivae are normal. No scleral icterus.  Neck: Neck supple. No tracheal deviation present.  Cardiovascular: Normal rate, regular rhythm, normal heart sounds and intact distal pulses.  Exam reveals no gallop and no friction rub.   No murmur heard. Pulmonary/Chest: Effort normal and breath sounds normal. No respiratory distress.  Abdominal: Soft. Normal appearance and bowel sounds are normal. She exhibits no distension and no mass.  There is tenderness. There is no rebound and no guarding.  Lower abd tenderness. Moderate. No incarc hernia.   Genitourinary:  No cva tenderness. Normal external exam. Cervix closed. No cervicitis. No purulent vaginal discharge. No cervical motion or adnexal tenderness or mass.   Musculoskeletal: She exhibits no edema.  Neurological: She is alert.  Skin: Skin is warm and dry. No rash noted.  Psychiatric: She has a normal mood and affect.    ED Course  Procedures (including critical care time)   Results for orders placed during the hospital  encounter of 01/29/13  URINALYSIS, ROUTINE W REFLEX MICROSCOPIC      Result Value Range   Color, Urine YELLOW  YELLOW   APPearance CLEAR  CLEAR   Specific Gravity, Urine 1.020  1.005 - 1.030   pH 6.0  5.0 - 8.0   Glucose, UA NEGATIVE  NEGATIVE mg/dL   Hgb urine dipstick NEGATIVE  NEGATIVE   Bilirubin Urine NEGATIVE  NEGATIVE   Ketones, ur NEGATIVE  NEGATIVE mg/dL   Protein, ur NEGATIVE  NEGATIVE mg/dL   Urobilinogen, UA 1.0  0.0 - 1.0 mg/dL   Nitrite NEGATIVE  NEGATIVE   Leukocytes, UA SMALL (*) NEGATIVE  URINE MICROSCOPIC-ADD ON      Result Value Range   Squamous Epithelial / LPF FEW (*) RARE   WBC, UA 3-6  <3 WBC/hpf   Bacteria, UA FEW (*) RARE   Urine-Other MUCOUS PRESENT    CBC      Result Value Range   WBC 8.5  4.0 - 10.5 K/uL   RBC 4.57  3.87 - 5.11 MIL/uL   Hemoglobin 12.6  12.0 - 15.0 g/dL   HCT 78.4  69.6 - 29.5 %   MCV 84.2  78.0 - 100.0 fL   MCH 27.6  26.0 - 34.0 pg   MCHC 32.7  30.0 - 36.0 g/dL   RDW 28.4  13.2 - 44.0 %   Platelets 277  150 - 400 K/uL  COMPREHENSIVE METABOLIC PANEL      Result Value Range   Sodium 136  135 - 145 mEq/L   Potassium 3.9  3.5 - 5.1 mEq/L   Chloride 102  96 - 112 mEq/L   CO2 27  19 - 32 mEq/L   Glucose, Bld 155 (*) 70 - 99 mg/dL   BUN 9  6 - 23 mg/dL   Creatinine, Ser 1.02  0.50 - 1.10 mg/dL   Calcium 9.0  8.4 - 72.5 mg/dL   Total Protein 7.5  6.0 - 8.3 g/dL   Albumin 3.5  3.5 - 5.2 g/dL   AST 34  0 - 37 U/L   ALT 55 (*) 0 - 35 U/L   Alkaline Phosphatase 99  39 - 117 U/L   Total Bilirubin 0.3  0.3 - 1.2 mg/dL   GFR calc non Af Amer >90  >90 mL/min   GFR calc Af Amer >90  >90 mL/min  LIPASE, BLOOD      Result Value Range   Lipase 33  11 - 59 U/L  POCT PREGNANCY, URINE      Result Value Range   Preg Test, Ur NEGATIVE  NEGATIVE   US Abdomen Complete  01/22/2013   *RADIOLOGY REPORT*  Clinical Data:  Postprandial right upper quadrant pain  ULTRASOUND ABDOMEN:  Technique:  Sonography of upper abdominal structures was  performed.  Comparison:  None  Gallbladder:  Normally distended without stones or wall thickening. No pericholecystic fluid or sonographic Murphy sign.  Common bile duct:  Normal caliber 4 mm diameter  Liver:  Echogenic, likely fatty infiltration, though this can be seen with cirrhosis and certain infiltrative disorders.  No definite focal hepatic mass or nodularity, though intrahepatic detail is severely limited due to poor sound transmission through echogenic parenchyma.  Hepatopetal portal venous flow.  IVC:  Inadequately visualized due to poor sound transmission through echogenic liver and bowel gas.  Pancreas:  Obscured by bowel gas  Spleen:  Normal appearance, 8.1 cm length  Right kidney:  12.0 cm length. Normal morphology without mass or hydronephrosis.  Left kidney:  12.1 cm length. Normal morphology without mass or hydronephrosis.  Aorta:  Visualized portion normal caliber, distally obscured by bowel gas  Other:  No free fluid  IMPRESSION: Probable fatty infiltration of liver as above. Inadequate visualization of IVC, pancreas and distal aorta. Remainder of exam unremarkable.   Original Report Authenticated By: Ulyses Southward, M.D.   Ct Abdomen Pelvis W Contrast  01/29/2013   *RADIOLOGY REPORT*  Clinical Data: Lower abdominal pain and nausea.  CT ABDOMEN AND PELVIS WITH CONTRAST  Technique:  Multidetector CT imaging of the abdomen and pelvis was performed following the standard protocol during bolus administration of intravenous contrast.  Contrast: OMNIPAQUE IOHEXOL 300 MG/ML  SOLN  Comparison: Abdominal ultrasound 01/22/2013.  CT 02/24/2009  Findings: Lung bases:  Mild subsegmental atelectasis at the lung bases.  Mild cardiomegaly, without pericardial or pleural effusion.  Abdomen/pelvis:  Marked hepatic steatosis.  Enlarged right lobe of the liver, measuring 21.5 cm cranial caudal.  Atypical appearance of the left lobe with a suggestion of atrophy.  This is unchanged.  Normal spleen, stomach,  pancreas, gallbladder, biliary tract, adrenal glands, kidneys.  No retroperitoneal or retrocrural adenopathy.  Edema is identified adjacent to the mid sigmoid colon on image 72/series 2. Favored to be centered on an area of pericolonic fat. Adjacent bowel normal in appearance.  The remainder of the colon is within normal limits. Normal terminal ileum.  Appendix surgically absent.  Normal small bowel without abdominal ascites.  No pelvic adenopathy. Normal urinary bladder and uterus.  No adnexal mass.  Bones/Musculoskeletal:  No acute osseous abnormality.  IMPRESSION:  1.  Central pelvic edema which is favored to be related to epiploic appendagitis.  Uncomplicated diverticulitis felt less likely. 2.  Hepatic steatosis and hepatomegaly.  Atypical morphology of the left lobe of the liver is similar and nonspecific.   Original Report Authenticated By: Jeronimo Greaves, M.D.      MDM  Iv ns. Labs. Ua.  Persistent lower abd tenderness. Ct.   Reviewed nursing notes and prior charts for additional history.   Pt drove self to ed.  Motrin po for pain.  Ct discussed w pt.   Recheck mild mid abd tenderness. No rebound or guarding.   Pt afeb, appears stable for d/c.         Suzi Roots, MD 01/29/13 1623

## 2013-01-29 NOTE — ED Notes (Signed)
Pt is here with lower abdominal pain that started yesterday and states had cramps for the last few days.  No diarrhea.  Reports nausea.  LMP- June 8th

## 2013-01-30 LAB — URINE CULTURE

## 2013-01-31 NOTE — ED Notes (Signed)
Post ED Visit - Positive Culture Follow-up  Culture report reviewed by antimicrobial stewardship pharmacist: []  Wes Dulaney, Pharm.D., BCPS [x]  Celedonio Miyamoto, Pharm.D., BCPS []  Georgina Pillion, [address].D., BCPS []  [place], [address].D., BCPS, AAHIVP []  Estella Husk, Pharm.D., BCPS, AAHIVP  Positive urine culture  no further patient follow-up is required at this time per [facility]  Larena Sox 01/31/2013, 1:04 PM

## 2013-09-19 ENCOUNTER — Ambulatory Visit: Payer: BC Managed Care – PPO

## 2013-09-19 ENCOUNTER — Encounter (HOSPITAL_COMMUNITY): Payer: Self-pay | Admitting: Emergency Medicine

## 2013-09-19 ENCOUNTER — Emergency Department (HOSPITAL_COMMUNITY)
Admission: EM | Admit: 2013-09-19 | Discharge: 2013-09-19 | Disposition: A | Discharge status: Home / Self Care | Payer: BC Managed Care – PPO | Source: Home / Self Care | Attending: Emergency Medicine | Admitting: Emergency Medicine

## 2013-09-19 DIAGNOSIS — M722 Plantar fascial fibromatosis: Secondary | ICD-10-CM

## 2013-09-19 MED ORDER — METHYLPREDNISOLONE ACETATE 40 MG/ML IJ SUSP
INTRAMUSCULAR | Status: AC
  Filled 2013-09-19: qty 5

## 2013-09-19 MED ORDER — MELOXICAM 15 MG PO TABS: 15.0000 mg | ORAL_TABLET | Freq: Every day | ORAL | Status: DC

## 2013-09-19 NOTE — Discharge Instructions (Signed)
Plantar fasciitis is a tendonitis (inflammed tendon) of the the plantar fascia, the tendon on the bottom of the foot that supports the arch.  Often the pain is localized to the heel and can be worse first thing in the morning after getting up or after sitting for a long period of time and tends to improve as the day goes by, only to worsen later on in the afternoon or evening after you have been on your feet for a long time.  Following the program outlined below cures most cases.  If conservative measures like these don't work, corticosteroid injection, or referral to a podiatrist are other options. ° °· Wear well fitting, lace up shoes with good arch support.  Tennis or running shoes are the best.  Do not wear heels, flip-flops, scuffs, or any kind of shoe without adequate support.   °· Use a heel lift.  These can be purchased at a shoe store or pharmacy.  They come in various price ranges.  Some people find that an orthotic insert with arch support works better. °· Wear a night splint.  These can be purchased on line at www.[employer].com.  The product to look for is the "Freedom Dorsal Night Splint."  [employer] also sells other products for plantar fasciitis including heel lifts and orthotic inserts. °· Use of over the counter pain meds can be of help.  Tylenol (or acetaminophen) is the safest to use.  It often helps to take this regularly.  You can take up to 2 325 mg tablets 5 times daily, but it best to start out much lower that that, perhaps 2 325 mg tablets twice daily, then increase from there. People who are on the blood thinner warfarin have to be careful about taking high doses of Tylenol.  For people who are able to tolerate them, ibuprofen and naproxyn can also help with the pain.  You should discuss these agents with your physician before taking them.  People with chronic kidney disease, hypertension, peptic ulcer disease, and reflux can suffer adverse side effects. They should not be taken with warfarin.  The maximum dosage of ibuprofen is 800 mg 3 times daily with meals.  The maximum dosage of naprosyn is 2 and 1/2 tablets twice daily with food, but again, start out low and gradually increase the dose until adequate pain relief is achieved. Ibuprofen and naprosyn should always be taken with food. °· Ice massage is helpful.  The best way to apply this is to freeze a bottle of water, place in on a towel and roll your foot over the bottle to produce an ice massage effect. This can be done as often as every 2 to 3 hours, depending on symptoms.  At the very least, try to do after you have been on your feet for a long period of time and after doing the exercises outlined below. °· Do the exercises outlined below twice daily: ° ° ° ° ° ° ° ° ° ° ° ° ° ° ° ° ° ° °

## 2013-09-19 NOTE — ED Notes (Signed)
Pain in her foot/heel for 4-5 weeks, pain in AM better after up for a few minutes, then better in AM when she take motrin the night before

## 2013-09-19 NOTE — ED Provider Notes (Signed)
Chief Complaint   Chief Complaint  Patient presents with  . Foot Pain    History of Present Illness   Christina Horton is a 48 year old female who has had a 4 to five-week history of pain in the plantar surface of the left heel. This hurts in the morning when she first gets up. It gets a little bit better during the day but then seems to get worse after she's been on it for while. She's noted a little bit of swelling medially. She denies any injury or trauma to the area. There is no pain in the posterior heel or in the anterior portion of the foot or the ankle. She had a similar pain in the right heel about a year ago. She was given a cortisone shot for that and it got better.  Review of Systems   Other than as noted above, the patient denies any of the following symptoms: Systemic:  No fevers, chills, or sweats.  No fatigue or tiredness. Musculoskeletal:  No joint pain, arthritis, bursitis, swelling, or back pain.  Neurological:  No muscular weakness, paresthesias.   PMFSH   Past medical history, family history, social history, meds, and allergies were reviewed.   She's been taking ibuprofen with some improvement.  Physical  Examination     Vital signs:  BP 147/88  Pulse 85  Temp(Src) 98.3 F (36.8 C) (Oral)  Resp 19  SpO2 99% Gen:  Alert and oriented times 3.  In no distress. Musculoskeletal:  Exam of the foot reveals pain to palpation over the insertion of the plantar fascia on the calcaneus. There was a small amount of swelling medially. No pain to palpation of the Achilles tendon or the lateral posterior calcaneus, the ankle, the dorsum of the foot, or the MTP joints. She has mild pain in the arch area.  Otherwise, all joints had a full a ROM with no swelling, bruising or deformity.  No edema, pulses full. Extremities were warm and pink.  Capillary refill was brisk.  Skin:  Clear, warm and dry.  No rash. Neuro:  Alert and oriented times 3.  Muscle strength was normal.   Sensation was intact to light touch.   Procedure Note   Verbal informed consent was obtained from the patient.  Risks and benefits were outlined with the patient.  Patient understands and accepts these risks. A time out was called and the procedure and identity of the patient were confirmed verbally.    The procedure was then performed as follows:  The medial portion of the heel was prepped with Betadine and alcohol. The point of insertion was identified. This was then anesthetized with ethyl chloride spray. Using a 1/2 inch 27-gauge needle, 1 mL of Depo-Medrol 40 mg strength and 1 mL of 2% Xylocaine were injected. The patient tolerated this procedure well. A Band-Aid was applied.  The patient tolerated the procedure well without any immediate complications.  Assessment   The encounter diagnosis was Plantar fasciitis.  Plan    1.  Meds:  The following meds were prescribed:   New Prescriptions   MELOXICAM (MOBIC) 15 MG TABLET    Take 1 tablet (15 mg total) by mouth daily.    2.  Patient Education/Counseling:  The patient was given appropriate handouts, self care instructions, and instructed in symptomatic relief including rest and activity, elevation, application of ice and compression.  She was given aftercare instructions. Suggested she stay off her feet for about 3 days. Then she can start doing  some stretching exercises. Also suggested that she buy a night splint.  3.  Follow up:  The patient was told to follow up here if no better in 3 to 4 days, or sooner if becoming worse in any way, and given some red flag symptoms such as worsening pain or neurological symptoms which would prompt immediate return.  Follow up with Dr. Cristie Hem if no better in 3 weeks.       Reuben Likes, MD 09/19/13 [phone]

## 2014-07-08 ENCOUNTER — Encounter (HOSPITAL_BASED_OUTPATIENT_CLINIC_OR_DEPARTMENT_OTHER): Payer: Self-pay | Admitting: *Deleted

## 2014-07-08 ENCOUNTER — Emergency Department (HOSPITAL_BASED_OUTPATIENT_CLINIC_OR_DEPARTMENT_OTHER)
Admission: EM | Admit: 2014-07-08 | Discharge: 2014-07-08 | Disposition: A | Discharge status: Home / Self Care | Payer: BC Managed Care – PPO | Attending: Emergency Medicine | Admitting: Emergency Medicine

## 2014-07-08 DIAGNOSIS — Z87891 Personal history of nicotine dependence: Secondary | ICD-10-CM | POA: Insufficient documentation

## 2014-07-08 DIAGNOSIS — Z791 Long term (current) use of non-steroidal anti-inflammatories (NSAID): Secondary | ICD-10-CM | POA: Insufficient documentation

## 2014-07-08 DIAGNOSIS — R05 Cough: Secondary | ICD-10-CM | POA: Diagnosis present

## 2014-07-08 DIAGNOSIS — Z79899 Other long term (current) drug therapy: Secondary | ICD-10-CM | POA: Diagnosis not present

## 2014-07-08 DIAGNOSIS — J208 Acute bronchitis due to other specified organisms: Secondary | ICD-10-CM | POA: Diagnosis not present

## 2014-07-08 HISTORY — DX: Bronchitis, not specified as acute or chronic: J40

## 2014-07-08 MED ORDER — ALBUTEROL SULFATE HFA 108 (90 BASE) MCG/ACT IN AERS
2.0000 | INHALATION_SPRAY | RESPIRATORY_TRACT | Status: DC | PRN
  Administered 2014-07-08: 2 via RESPIRATORY_TRACT
  Filled 2014-07-08: qty 6.7

## 2014-07-08 NOTE — Discharge Instructions (Signed)
Return to the ED with any concerns including difficulty breathing despite using albuterol every 4 hours, not drinking fluids, decreased urine output, vomiting and not able to keep down liquids or medications, decreased level of alertness/lethargy, or any other alarming symptoms °

## 2014-07-08 NOTE — ED Provider Notes (Signed)
CSN: [id number]     Arrival date & time 07/08/14  1056 History   First MD Initiated Contact with Patient 07/08/14 1108     Chief Complaint  Patient presents with  . Cough     (Consider location/radiation/quality/duration/timing/severity/associated sxs/prior Treatment) HPI  Pt presenting with c/o sore throat, cough and body aches which began 3 days ago.  No fever.  She has had no vomiting or diarrhea.  No difficulty breathing.  Cough is nonproductive. She has hx of bronchitis in the past and states this feels similar.  She has no tried anything for her symptoms prior to arrival.  No sick contacts.  No recent travel.  There are no other associated systemic symptoms, there are no other alleviating or modifying factors.   Past Medical History  Diagnosis Date  . Bronchitis    Past Surgical History  Procedure Laterality Date  . Abdominal surgery    . Tubal ligation    . Appendectomy     Family History  Problem Relation Age of Onset  . Cancer Sister    History  Substance Use Topics  . Smoking status: Former Games developermoker  . Smokeless tobacco: Never Used  . Alcohol Use: Yes     Comment: socially   OB History    No data available     Review of Systems  ROS reviewed and all otherwise negative except for mentioned in HPI    Allergies  Review of patient's allergies indicates no known allergies.  Home Medications   Prior to Admission medications   Medication Sig Start Date End Date Taking? Authorizing Provider  ibuprofen (ADVIL,MOTRIN) 600 MG tablet Take 1 tablet (600 mg total) by mouth every 8 (eight) hours as needed for pain. Take with food. 01/29/13  Yes Suzi RootsKevin E Steinl, MD  diclofenac (VOLTAREN) 75 MG EC tablet Take one twice daily with food for inflammation and pain. Take on regular basis. 10/31/12   Peyton Najjaravid H Hopper, MD  meloxicam (MOBIC) 15 MG tablet Take 1 tablet (15 mg total) by mouth daily. 09/19/13   Reuben Likesavid C Keller, MD  omeprazole (PRILOSEC) 40 MG capsule Take 1 capsule (40 mg  total) by mouth daily. 01/17/13   Calvert CantorSaima Rizwan, MD  traMADol (ULTRAM) 50 MG tablet Take one every 6 hours as needed for severe pain only 10/31/12   Peyton Najjaravid H Hopper, MD   BP 127/84 mmHg  Pulse 87  Temp(Src) 99.1 F (37.3 C) (Oral)  Resp 16  Ht 5\' 2"  (1.575 m)  Wt 257 lb (116.574 kg)  BMI 46.99 kg/m2  SpO2 95%  LMP 07/06/2014  Vitals reviewed Physical Exam  Physical Examination: General appearance - alert, well appearing, and in no distress Mental status - alert, oriented to person, place, and time Eyes - no conjunctival injection, no scleral icterus Mouth - mucous membranes moist, pharynx normal without lesions Neck - supple, no significant adenopathy Chest - clear to auscultation, no wheezes, rales or rhonchi, symmetric air entry, normal respiratory effort Heart - normal rate, regular rhythm, normal S1, S2, no murmurs, rubs, clicks or gallops Abdomen - soft, nontender, nondistended, no masses or organomegaly Extremities - peripheral pulses normal, no pedal edema, no clubbing or cyanosis Skin - normal coloration and turgor, no rashes  ED Course  Procedures (including critical care time) Labs Review Labs Reviewed - No data to display  Imaging Review No results found.   EKG Interpretation None      MDM   Final diagnoses:  Viral bronchitis    Pt  presenting with c/o cough adn sore throat with body aches.  Most c/w viral syndrome.  Pt has no tachypnea or hypoxia to suggest pneumonia.   Patient is overall nontoxic and well hydrated in appearance.  Pt given albuterol inhaler to help with symptoms of bronchitis.  Advised po fluids, ibuprofen/tyelnol for body aches.  Discharged with strict return precautions.  Pt agreeable with plan.     Ethelda ChickMartha K Linker, MD 07/08/14 [phone]

## 2014-07-08 NOTE — ED Notes (Signed)
Pt given inhaler by RT with instructions for use- work note given per Dr Karma GanjaLinker

## 2014-07-08 NOTE — ED Notes (Signed)
Cough and sore throat since friday 

## 2017-03-21 ENCOUNTER — Other Ambulatory Visit (HOSPITAL_BASED_OUTPATIENT_CLINIC_OR_DEPARTMENT_OTHER): Payer: Self-pay | Admitting: Physician Assistant

## 2017-03-21 DIAGNOSIS — Z1231 Encounter for screening mammogram for malignant neoplasm of breast: Secondary | ICD-10-CM

## 2017-04-10 ENCOUNTER — Emergency Department (HOSPITAL_BASED_OUTPATIENT_CLINIC_OR_DEPARTMENT_OTHER): Payer: BLUE CROSS/BLUE SHIELD

## 2017-04-10 ENCOUNTER — Emergency Department (HOSPITAL_BASED_OUTPATIENT_CLINIC_OR_DEPARTMENT_OTHER)
Admission: EM | Admit: 2017-04-10 | Discharge: 2017-04-10 | Disposition: A | Discharge status: Home / Self Care | Payer: BLUE CROSS/BLUE SHIELD | Attending: Emergency Medicine | Admitting: Emergency Medicine

## 2017-04-10 ENCOUNTER — Encounter (HOSPITAL_BASED_OUTPATIENT_CLINIC_OR_DEPARTMENT_OTHER): Payer: Self-pay | Admitting: Emergency Medicine

## 2017-04-10 DIAGNOSIS — Z7984 Long term (current) use of oral hypoglycemic drugs: Secondary | ICD-10-CM | POA: Diagnosis not present

## 2017-04-10 DIAGNOSIS — E119 Type 2 diabetes mellitus without complications: Secondary | ICD-10-CM | POA: Diagnosis not present

## 2017-04-10 DIAGNOSIS — R1011 Right upper quadrant pain: Secondary | ICD-10-CM | POA: Diagnosis not present

## 2017-04-10 DIAGNOSIS — Z87891 Personal history of nicotine dependence: Secondary | ICD-10-CM | POA: Insufficient documentation

## 2017-04-10 DIAGNOSIS — Z79899 Other long term (current) drug therapy: Secondary | ICD-10-CM | POA: Insufficient documentation

## 2017-04-10 HISTORY — DX: Type 2 diabetes mellitus without complications: E11.9

## 2017-04-10 LAB — COMPREHENSIVE METABOLIC PANEL
ALT: 15 U/L (ref 14–54)
ANION GAP: 7 (ref 5–15)
AST: 14 U/L — ABNORMAL LOW (ref 15–41)
Albumin: 3.5 g/dL (ref 3.5–5.0)
Alkaline Phosphatase: 80 U/L (ref 38–126)
BUN: 14 mg/dL (ref 6–20)
CO2: 26 mmol/L (ref 22–32)
CREATININE: 0.62 mg/dL (ref 0.44–1.00)
Calcium: 9.5 mg/dL (ref 8.9–10.3)
Chloride: 105 mmol/L (ref 101–111)
GFR calc non Af Amer: >60 mL/min (ref >60)
Glucose, Bld: 148 mg/dL — ABNORMAL HIGH (ref 65–99)
Potassium: 3.8 mmol/L (ref 3.5–5.1)
SODIUM: 138 mmol/L (ref 135–145)
Total Bilirubin: 0.2 mg/dL — ABNORMAL LOW (ref 0.3–1.2)
Total Protein: 7.6 g/dL (ref 6.5–8.1)

## 2017-04-10 LAB — PREGNANCY, URINE: Preg Test, Ur: NEGATIVE

## 2017-04-10 LAB — CBC WITH DIFFERENTIAL/PLATELET
BASOS PCT: 0 %
Basophils Absolute: 0 10*3/uL (ref 0.0–0.1)
EOS PCT: 2 %
Eosinophils Absolute: 0.2 10*3/uL (ref 0.0–0.7)
HCT: 34 % — ABNORMAL LOW (ref 36.0–46.0)
Hemoglobin: 10.3 g/dL — ABNORMAL LOW (ref 12.0–15.0)
LYMPHS ABS: 1.8 10*3/uL (ref 0.7–4.0)
Lymphocytes Relative: 21 %
MCH: 24.3 pg — AB (ref 26.0–34.0)
MCHC: 30.3 g/dL (ref 30.0–36.0)
MCV: 80.2 fL (ref 78.0–100.0)
Monocytes Absolute: 0.8 10*3/uL (ref 0.1–1.0)
Monocytes Relative: 9 %
Neutro Abs: 5.8 10*3/uL (ref 1.7–7.7)
Neutrophils Relative %: 68 %
PLATELETS: 397 10*3/uL (ref 150–400)
RBC: 4.24 MIL/uL (ref 3.87–5.11)
RDW: 16 % — ABNORMAL HIGH (ref 11.5–15.5)
WBC: 8.5 10*3/uL (ref 4.0–10.5)

## 2017-04-10 LAB — URINALYSIS, ROUTINE W REFLEX MICROSCOPIC
BILIRUBIN URINE: NEGATIVE
Glucose, UA: 250 mg/dL — AB
Hgb urine dipstick: NEGATIVE
KETONES UR: NEGATIVE mg/dL
Leukocytes, UA: NEGATIVE
NITRITE: NEGATIVE
Protein, ur: NEGATIVE mg/dL
Specific Gravity, Urine: 1.025 (ref 1.005–1.030)
pH: 6 (ref 5.0–8.0)

## 2017-04-10 LAB — LIPASE, BLOOD: Lipase: 28 U/L (ref 11–51)

## 2017-04-10 MED ORDER — CYCLOBENZAPRINE HCL 10 MG PO TABS: 10.0000 mg | ORAL_TABLET | Freq: Two times a day (BID) | ORAL | 0 refills | Status: DC | PRN

## 2017-04-10 NOTE — ED Triage Notes (Addendum)
RUQ abd pain x 1 week with nausea. Pt states pain started 2 days after starting metformin.

## 2017-04-10 NOTE — ED Provider Notes (Signed)
[facility] Provider Note   CSN: [id number] Arrival date & time: 04/10/17  1635     History   Chief Complaint Chief Complaint  Patient presents with  . Abdominal Pain    HPI Christina Horton is a 51 y.o. female.  Patient is a 51 year old female with a history of diabetes who recently started on metformin presenting with 1 week of abdominal pain. She states initially when she started to take the metformin she had some abdominal cramping and diarrhea which resolved after the first day however she is continued to have ongoing right upper quadrant pain for the last week with her she takes the metformin or not. It does not seem to be affected by eating but has caused nausea. She states it's always a 5 out of 10 but this morning it woke her up from sleep at 812 out of 10 with some improvement after taking ibuprofen. She denies any urinary complaints and LMP was within the last few weeks. She denies any lower abdominal pain and the pain does not radiate. She denies any flank pain.   The history is provided by the patient.  Abdominal Pain   This is a new problem. Episode onset: 1 week ago. The problem has been gradually worsening. Associated with: Patient initially noticed the pain when she started taking metformin but it does not seem to be affected by eating. It seems to be worse with movement. The pain is located in the RUQ. The pain is at a severity of 5/10. The pain is moderate. Associated symptoms include diarrhea and nausea. Pertinent negatives include anorexia, fever, vomiting, constipation, dysuria and myalgias. The symptoms are aggravated by deep breathing, activity and certain positions. The symptoms are relieved by NSAIDs (Improved with ibuprofen but nothing has taken it away.). Past medical history comments: Diabetes recently started on metformin.    Past Medical History:  Diagnosis Date  . Bronchitis   . Diabetes mellitus without complication (HCC)     There are no  active problems to display for this patient.   Past Surgical History:  Procedure Laterality Date  . ABDOMINAL SURGERY    . APPENDECTOMY    . TUBAL LIGATION      OB History    No data available       Home Medications    Prior to Admission medications   Medication Sig Start Date End Date Taking? Authorizing Provider  metFORMIN (GLUCOPHAGE) 500 MG tablet Take by mouth 2 (two) times daily with a meal.   Yes [provider]  diclofenac (VOLTAREN) 75 MG EC tablet Take one twice daily with food for inflammation and pain. Take on regular basis. 10/31/12   Peyton NajjarHopper, David H, MD  ibuprofen (ADVIL,MOTRIN) 600 MG tablet Take 1 tablet (600 mg total) by mouth every 8 (eight) hours as needed for pain. Take with food. 01/29/13   Cathren LaineSteinl, Kevin, MD  meloxicam (MOBIC) 15 MG tablet Take 1 tablet (15 mg total) by mouth daily. 09/19/13   Reuben LikesKeller, David C, MD  omeprazole (PRILOSEC) 40 MG capsule Take 1 capsule (40 mg total) by mouth daily. 01/17/13   Calvert Cantorizwan, Saima, MD  traMADol Janean Sark(ULTRAM) 50 MG tablet Take one every 6 hours as needed for severe pain only 10/31/12   Peyton NajjarHopper, David H, MD    Family History Family History  Problem Relation Age of Onset  . Cancer Sister     Social History Social History  Substance Use Topics  . Smoking status: Former Games developermoker  . Smokeless  tobacco: Never Used  . Alcohol use Yes     Comment: socially     Allergies   Patient has no known allergies.   Review of Systems Review of Systems  Constitutional: Negative for fever.  Gastrointestinal: Positive for abdominal pain, diarrhea and nausea. Negative for anorexia, constipation and vomiting.  Genitourinary: Negative for dysuria.  Musculoskeletal: Negative for myalgias.  All other systems reviewed and are negative.    Physical Exam Updated Vital Signs BP 133/72 (BP Location: Right Arm)   Pulse 81   Temp 98.3 F (36.8 C) (Oral)   Resp 18   Ht 5\' 2"  (1.575 m)   Wt 107 kg (236 lb)   LMP 04/04/2017  (Approximate)   SpO2 99%   BMI 43.16 kg/m   Physical Exam  Constitutional: She is oriented to person, place, and time. She appears well-developed and well-nourished. No distress.  HENT:  Head: Normocephalic and atraumatic.  Mouth/Throat: Oropharynx is clear and moist.  Eyes: Pupils are equal, round, and reactive to light. Conjunctivae and EOM are normal.  Neck: Normal range of motion. Neck supple.  Cardiovascular: Normal rate, regular rhythm and intact distal pulses.   No murmur heard. Pulmonary/Chest: Effort normal and breath sounds normal. No respiratory distress. She has no wheezes. She has no rales.  Abdominal: Soft. She exhibits no distension. There is tenderness in the right upper quadrant. There is guarding. There is no rebound and negative Murphy's sign.    Patient has an obese abdomen so technically difficult exam but concern for possible mild hepatomegaly. No CVA tenderness  Musculoskeletal: Normal range of motion. She exhibits no edema or tenderness.  Neurological: She is alert and oriented to person, place, and time.  Skin: Skin is warm and dry. No rash noted. No erythema.  Psychiatric: She has a normal mood and affect. Her behavior is normal.  Nursing note and vitals reviewed.    ED Treatments / Results  Labs (all labs ordered are listed, but only abnormal results are displayed) Labs Reviewed  URINALYSIS, ROUTINE W REFLEX MICROSCOPIC - Abnormal; Notable for the following:       Result Value   Glucose, UA 250 (*)    All other components within normal limits  CBC WITH DIFFERENTIAL/PLATELET - Abnormal; Notable for the following:    Hemoglobin 10.3 (*)    HCT 34.0 (*)    MCH 24.3 (*)    RDW 16.0 (*)    All other components within normal limits  COMPREHENSIVE METABOLIC PANEL - Abnormal; Notable for the following:    Glucose, Bld 148 (*)    AST 14 (*)    Total Bilirubin 0.2 (*)    All other components within normal limits  PREGNANCY, URINE  LIPASE, BLOOD     EKG  EKG Interpretation None       Radiology US Abdomen Limited Ruq  Result Date: 04/10/2017 CLINICAL DATA:  Right side abdomen pain for 1 week. EXAM: ULTRASOUND ABDOMEN LIMITED RIGHT UPPER QUADRANT COMPARISON:  None. FINDINGS: Gallbladder: The gallbladder is contracted. No sonographic Murphy sign noted by sonographer. The gallbladder wall measures 2 mm. Common bile duct: Diameter: 3 mm Liver: There is diffuse increased echotexture of the liver. The left lobe is not well seen. IMPRESSION: Contracted gallbladder.  No sonographic Murphy's sign identified. Fatty infiltration of liver. Electronically Signed   By: Sherian Rein M.D.   On: 04/10/2017 18:01    Procedures Procedures (including critical care time)  Medications Ordered in ED Medications - No data to  display   Initial Impression / Assessment and Plan / ED Course  I have reviewed the triage vital signs and the nursing notes.  Pertinent labs & imaging results that were available during my care of the patient were reviewed by me and considered in my medical decision making (see chart for details).    Patient presenting with right upper quadrant pain for the last week. It waxes and wanes in severity but has never gone away.  It seems to be worse with certain positions and movement. It does not seem to be affected by eating. She denies any fever or infectious symptoms. Symptoms started after taking metformin but seem to be there whether she takes the medication or not. She recently had lab work done with normal liver function test and denies a history of alcohol use or hepatitis C. Concern for possible gallbladder pathology versus mild hepatitis from recent medication. Lower suspicion for kidney stone as patient has no flank pain the pain does not radiate and she has no blood in her urine. UA without signs of infection and low concern for pyelonephritis. CBC within normal limits other than anemia which is known to the patient. CMP and  lipase pending. Ultrasound of the right upper quadrant pending.  7:01 PM Labs and u/s wnl.  Feel that patient's symptoms could be related to abdominal wall muscular pain. Discussed this with the patient. She will continue ibuprofen and use muscle relaxer as needed. She'll follow-up with her doctor.  Final Clinical Impressions(s) / ED Diagnoses   Final diagnoses:  RUQ pain  Abdominal wall pain in right upper quadrant    New Prescriptions New Prescriptions   CYCLOBENZAPRINE (FLEXERIL) 10 MG TABLET    Take 1 tablet (10 mg total) by mouth 2 (two) times daily as needed for muscle spasms.     Gwyneth Sprout, MD 04/10/17 1901

## 2017-04-10 NOTE — ED Notes (Signed)
Patient transported to Ultrasound 

## 2017-04-10 NOTE — ED Notes (Signed)
Pt discharged to home NAD.  

## 2017-04-19 ENCOUNTER — Ambulatory Visit (HOSPITAL_BASED_OUTPATIENT_CLINIC_OR_DEPARTMENT_OTHER)
Admission: RE | Admit: 2017-04-19 | Discharge: 2017-04-19 | Disposition: A | Discharge status: Home / Self Care | Payer: BLUE CROSS/BLUE SHIELD | Source: Ambulatory Visit | Attending: Physician Assistant | Admitting: Physician Assistant

## 2017-04-19 ENCOUNTER — Encounter (HOSPITAL_BASED_OUTPATIENT_CLINIC_OR_DEPARTMENT_OTHER): Payer: Self-pay

## 2017-04-19 DIAGNOSIS — R928 Other abnormal and inconclusive findings on diagnostic imaging of breast: Secondary | ICD-10-CM | POA: Diagnosis not present

## 2017-04-19 DIAGNOSIS — Z1231 Encounter for screening mammogram for malignant neoplasm of breast: Secondary | ICD-10-CM | POA: Insufficient documentation

## 2017-04-21 ENCOUNTER — Other Ambulatory Visit: Payer: Self-pay | Admitting: Physician Assistant

## 2017-04-21 DIAGNOSIS — R928 Other abnormal and inconclusive findings on diagnostic imaging of breast: Secondary | ICD-10-CM

## 2017-04-27 ENCOUNTER — Ambulatory Visit
Admission: RE | Admit: 2017-04-27 | Discharge: 2017-04-27 | Disposition: A | Discharge status: Home / Self Care | Payer: BLUE CROSS/BLUE SHIELD | Source: Ambulatory Visit | Attending: Physician Assistant | Admitting: Physician Assistant

## 2017-04-27 DIAGNOSIS — R928 Other abnormal and inconclusive findings on diagnostic imaging of breast: Secondary | ICD-10-CM

## 2017-12-15 ENCOUNTER — Emergency Department (HOSPITAL_BASED_OUTPATIENT_CLINIC_OR_DEPARTMENT_OTHER): Payer: BLUE CROSS/BLUE SHIELD

## 2017-12-15 ENCOUNTER — Encounter (HOSPITAL_BASED_OUTPATIENT_CLINIC_OR_DEPARTMENT_OTHER): Payer: Self-pay | Admitting: Adult Health

## 2017-12-15 ENCOUNTER — Other Ambulatory Visit: Payer: Self-pay

## 2017-12-15 ENCOUNTER — Emergency Department (HOSPITAL_BASED_OUTPATIENT_CLINIC_OR_DEPARTMENT_OTHER)
Admission: EM | Admit: 2017-12-15 | Discharge: 2017-12-15 | Disposition: A | Discharge status: Home / Self Care | Payer: BLUE CROSS/BLUE SHIELD | Attending: Emergency Medicine | Admitting: Emergency Medicine

## 2017-12-15 DIAGNOSIS — E119 Type 2 diabetes mellitus without complications: Secondary | ICD-10-CM | POA: Insufficient documentation

## 2017-12-15 DIAGNOSIS — Z7984 Long term (current) use of oral hypoglycemic drugs: Secondary | ICD-10-CM | POA: Insufficient documentation

## 2017-12-15 DIAGNOSIS — M722 Plantar fascial fibromatosis: Secondary | ICD-10-CM | POA: Diagnosis not present

## 2017-12-15 DIAGNOSIS — M79671 Pain in right foot: Secondary | ICD-10-CM | POA: Diagnosis present

## 2017-12-15 DIAGNOSIS — M7750 Other enthesopathy of unspecified foot: Secondary | ICD-10-CM

## 2017-12-15 MED ORDER — DICLOFENAC SODIUM 75 MG PO TBEC: 75.0000 mg | DELAYED_RELEASE_TABLET | Freq: Two times a day (BID) | ORAL | 0 refills | Status: DC

## 2017-12-15 NOTE — ED Provider Notes (Signed)
[facility] EMERGENCY DEPARTMENT Provider Note   CSN: [id number] Arrival date & time: 12/15/17  1856     History   Chief Complaint Chief Complaint  Patient presents with  . Ankle Pain    HPI Christina Horton is a 52 y.o. female.  HPI Patient states she walks on concrete all day while at work.  Over the last 2 to 3 days she has had gradual onset pain to the arch of her right foot.  No known trauma.  No swelling, redness or warmth.  States the pain feels similar to when she was diagnosed with plantar fasciitis of her left foot. Past Medical History:  Diagnosis Date  . Bronchitis   . Diabetes mellitus without complication (HCC)     There are no active problems to display for this patient.   Past Surgical History:  Procedure Laterality Date  . ABDOMINAL SURGERY    . APPENDECTOMY    . TUBAL LIGATION       OB History   None      Home Medications    Prior to Admission medications   Medication Sig Start Date End Date Taking? Authorizing Provider  cyclobenzaprine (FLEXERIL) 10 MG tablet Take 1 tablet (10 mg total) by mouth 2 (two) times daily as needed for muscle spasms. 04/10/17   Gwyneth Sprout, MD  diclofenac (VOLTAREN) 75 MG EC tablet Take 1 tablet (75 mg total) by mouth 2 (two) times daily after a meal. Take one twice daily with food for inflammation and pain. Take on regular basis. 12/15/17   Loren Racer, MD  ibuprofen (ADVIL,MOTRIN) 600 MG tablet Take 1 tablet (600 mg total) by mouth every 8 (eight) hours as needed for pain. Take with food. 01/29/13   Cathren Laine, MD  meloxicam (MOBIC) 15 MG tablet Take 1 tablet (15 mg total) by mouth daily. 09/19/13   Reuben Likes, MD  metFORMIN (GLUCOPHAGE) 500 MG tablet Take by mouth 2 (two) times daily with a meal.    [provider]  omeprazole (PRILOSEC) 40 MG capsule Take 1 capsule (40 mg total) by mouth daily. 01/17/13   Calvert Cantor, MD  traMADol Janean Sark) 50 MG tablet Take one every 6 hours as  needed for severe pain only 10/31/12   Peyton Najjar, MD    Family History Family History  Problem Relation Age of Onset  . Cancer Sister     Social History Social History   Tobacco Use  . Smoking status: Former Games developer  . Smokeless tobacco: Never Used  Substance Use Topics  . Alcohol use: Yes    Comment: socially  . Drug use: No     Allergies   Patient has no known allergies.   Review of Systems Review of Systems  Constitutional: Negative for chills and fever.  Musculoskeletal: Positive for arthralgias and myalgias. Negative for joint swelling.  Skin: Negative for rash and wound.  Neurological: Negative for dizziness, weakness, numbness and headaches.     Physical Exam Updated Vital Signs BP (!) 183/89   Pulse (!) 109   Temp 98.6 F (37 C) (Oral)   Resp 20   Ht  (1.575 m)   Wt 110.7 kg (244 lb)   SpO2 100%   BMI 44.63 kg/m   Physical Exam  Constitutional: She is oriented to person, place, and time. She appears well-developed and well-nourished. No distress.  HENT:  Head: Normocephalic and atraumatic.  Eyes: Pupils are equal, round, and reactive to light. EOM are normal.  Neck: Normal range of motion. Neck supple.  Cardiovascular: Normal rate.  Pulmonary/Chest: Effort normal.  Abdominal: Soft.  Musculoskeletal: Normal range of motion. She exhibits tenderness. She exhibits no edema.       Feet:  Distal pulses are 2+.  No calf swelling or tenderness.  Neurological: She is alert and oriented to person, place, and time.  Sensation fully intact.  5/5 motor in all extremities.  Skin: Skin is warm and dry. No rash noted. She is not diaphoretic. No erythema.  Psychiatric: She has a normal mood and affect. Her behavior is normal.  Nursing note and vitals reviewed.    ED Treatments / Results  Labs (all labs ordered are listed, but only abnormal results are displayed) Labs Reviewed - No data to display  EKG None  Radiology Dg Foot Complete  Right  Result Date: 12/15/2017 CLINICAL DATA:  52 y/o F; right foot pain near the medial malleolus extending under the side of the foot for 3 weeks. EXAM: RIGHT FOOT COMPLETE - 3+ VIEW COMPARISON:  None. FINDINGS: No acute fracture or dislocation identified. Pes planus. Small dorsal and plantar calcaneal enthesophytes. IMPRESSION: 1. No acute fracture or dislocation identified. 2. Pes planus. 3. Small dorsal and plantar calcaneal enthesophytes. Electronically Signed   By: Mitzi Hansen M.D.   On: 12/15/2017 20:53    Procedures Procedures (including critical care time)  Medications Ordered in ED Medications - No data to display   Initial Impression / Assessment and Plan / ED Course  I have reviewed the triage vital signs and the nursing notes.  Pertinent labs & imaging results that were available during my care of the patient were reviewed by me and considered in my medical decision making (see chart for details).     No acute findings on x-ray.  Will treat with NSAIDs.  Advised follow-up with podiatry.  Return precautions given.  Final Clinical Impressions(s) / ED Diagnoses   Final diagnoses:  Plantar fasciitis of right foot    ED Discharge Orders        Ordered    diclofenac (VOLTAREN) 75 MG EC tablet  2 times daily after meals     12/15/17 2113       Loren Racer, MD 12/16/17 0007

## 2017-12-15 NOTE — ED Triage Notes (Signed)
Presents with right medial ankle, foot and heel pain that is worse with walking, ongoing for a few days. She endorses hx of plantar fascitis and this feels the same

## 2019-03-01 ENCOUNTER — Other Ambulatory Visit: Payer: Self-pay

## 2019-03-01 ENCOUNTER — Emergency Department (HOSPITAL_BASED_OUTPATIENT_CLINIC_OR_DEPARTMENT_OTHER)
Admission: EM | Admit: 2019-03-01 | Discharge: 2019-03-01 | Disposition: A | Discharge status: Home / Self Care | Payer: BC Managed Care – PPO | Attending: Emergency Medicine | Admitting: Emergency Medicine

## 2019-03-01 ENCOUNTER — Encounter (HOSPITAL_BASED_OUTPATIENT_CLINIC_OR_DEPARTMENT_OTHER): Payer: Self-pay | Admitting: Emergency Medicine

## 2019-03-01 DIAGNOSIS — H81392 Other peripheral vertigo, left ear: Secondary | ICD-10-CM | POA: Diagnosis not present

## 2019-03-01 DIAGNOSIS — E119 Type 2 diabetes mellitus without complications: Secondary | ICD-10-CM | POA: Insufficient documentation

## 2019-03-01 DIAGNOSIS — R42 Dizziness and giddiness: Secondary | ICD-10-CM | POA: Diagnosis present

## 2019-03-01 DIAGNOSIS — Z87891 Personal history of nicotine dependence: Secondary | ICD-10-CM | POA: Diagnosis not present

## 2019-03-01 MED ORDER — MECLIZINE HCL 25 MG PO TABS: 25.0000 mg | ORAL_TABLET | Freq: Three times a day (TID) | ORAL | 0 refills | Status: AC | PRN

## 2019-03-01 MED ORDER — MECLIZINE HCL 25 MG PO TABS: 25.0000 mg | ORAL_TABLET | Freq: Three times a day (TID) | ORAL | 0 refills | Status: DC | PRN

## 2019-03-01 NOTE — Discharge Instructions (Addendum)
You were evaluated in the Emergency Department and after careful evaluation, we did not find any emergent condition requiring admission or further testing in the hospital.  Your symptoms today seem to be due to vertigo.  You can use the meclizine medication at home as needed for return of symptoms.  You could also try the Epley maneuver again at home.  Please return to the Emergency Department if you experience any worsening of your condition.  We encourage you to follow up with a primary care provider.  Thank you for allowing [place] to be a part of your care.

## 2019-03-01 NOTE — ED Provider Notes (Signed)
[facility] Emergency Department Provider Note MRN:  [id number]  Arrival date & time: 03/01/19     Chief Complaint   Dizziness   History of Present Illness   Christina Horton is a 53 y.o. year-old female with a history of diabetes presenting to the ED with chief complaint of dizziness.  4 or 5 days of persistent dizziness, described as a room spinning, occurring when patient is moving her head, usually when she moves her head to the left.  Associated with intermittent nausea and vomiting when the symptoms/spinning sensation is severe.  Denies numbness or weakness to the arms or legs, no slurred speech, no confusion, no vision loss.  Denies chest pain or shortness of breath, no abdominal pain.  Review of Systems  A complete 10 system review of systems was obtained and all systems are negative except as noted in the HPI and PMH.   Patient's Health History    Past Medical History:  Diagnosis Date  . Bronchitis   . Diabetes mellitus without complication [facility])     Past Surgical History:  Procedure Laterality Date  . ABDOMINAL SURGERY    . APPENDECTOMY    . TUBAL LIGATION      Family History  Problem Relation Age of Onset  . Cancer Sister     Social History   Socioeconomic History  . Marital status: Single    Spouse name: Not on file  . Number of children: Not on file  . Years of education: Not on file  . Highest education level: Not on file  Occupational History  . Not on file  Social Needs  . Financial resource strain: Not on file  . Food insecurity    Worry: Not on file    Inability: Not on file  . Transportation needs    Medical: Not on file    Non-medical: Not on file  Tobacco Use  . Smoking status: Former Research scientist (life sciences)  . Smokeless tobacco: Never Used  Substance and Sexual Activity  . Alcohol use: Yes    Comment: socially  . Drug use: No  . Sexual activity: Yes    Birth control/protection: Surgical  Lifestyle  . Physical activity    Days per week: Not on file    Minutes per session: Not on file  . Stress: Not on file  Relationships  . Social Herbalist on phone: Not on file    Gets together: Not on file    Attends religious service: Not on file    Active member of club or organization: Not on file    Attends meetings of clubs or organizations: Not on file    Relationship status: Not on file  . Intimate partner violence    Fear of current or ex partner: Not on file    Emotionally abused: Not on file    Physically abused: Not on file    Forced sexual activity: Not on file  Other Topics Concern  . Not on file  Social History Narrative  . Not on file     Physical Exam  Vital Signs and Nursing Notes reviewed Vitals:   03/01/19 1024  BP: 130/84  Pulse: 60  Resp: 16  Temp: 98.4 F (36.9 C)  SpO2: 98%    CONSTITUTIONAL: Well-appearing, NAD NEURO:  Alert and oriented x 3, normal and symmetric strength and sensation, normal coordination, normal speech EYES:  eyes equal and reactive ENT/NECK:  no LAD, no JVD CARDIO: Regular rate, well-perfused,  normal S1 and S2 PULM:  CTAB no wheezing or rhonchi GI/GU:  normal bowel sounds, non-distended, non-tender MSK/SPINE:  No gross deformities, no edema SKIN:  no rash, atraumatic PSYCH:  Appropriate speech and behavior  Diagnostic and Interventional Summary    EKG Interpretation  Date/Time:  Thursday March 01 2019 11:05:11 EDT Ventricular Rate:  85 PR Interval:    QRS Duration: 84 QT Interval:  397 QTC Calculation: 473 R Axis:   39 Text Interpretation:  Sinus rhythm Confirmed by Kennis CarinaBero, Deakon Frix [phone]([id number]) on 03/01/2019 11:07:18 AM      Labs Reviewed - No data to display  No orders to display    Medications - No data to display   Procedures Critical Care  ED Course and Medical Decision Making  I have reviewed the triage vital signs and the nursing notes.  Pertinent labs & imaging results that were available during my care of the patient were  reviewed by me and considered in my medical decision making (see below for details).  Vertigo, favored peripheral given the consistent trigger of change in head position, usually to the left.  Normal vital signs, otherwise reassuring neurological exam, no other symptoms.  Patient's symptoms are resolved after Epley maneuver here in the emergency department.  Will provide meclizine to help at home in case of recurrence, advised PCP follow-up.  After the discussed management above, the patient was determined to be safe for discharge.  The patient was in agreement with this plan and all questions regarding their care were answered.  ED return precautions were discussed and the patient will return to the ED with any significant worsening of condition.  Elmer SowMichael M. Pilar PlateBero, MD [facility] [email or web address]  Final Clinical Impressions(s) / ED Diagnoses     ICD-10-CM   1. Peripheral vertigo involving left ear  H81.392     ED Discharge Orders         Ordered    meclizine (ANTIVERT) 25 MG tablet  3 times daily PRN     03/01/19 1106             Sabas SousBero, Vilas Edgerly M, MD 03/01/19 [phone]

## 2019-03-01 NOTE — ED Triage Notes (Signed)
Dizziness since Sunday.  Some room spinning and blurry vision which has been intermittent.  Pt states when she closes her eyes, she feels like she is sinking.

## 2019-05-18 ENCOUNTER — Encounter (HOSPITAL_BASED_OUTPATIENT_CLINIC_OR_DEPARTMENT_OTHER): Payer: Self-pay

## 2019-05-18 ENCOUNTER — Emergency Department (HOSPITAL_BASED_OUTPATIENT_CLINIC_OR_DEPARTMENT_OTHER): Payer: BC Managed Care – PPO

## 2019-05-18 ENCOUNTER — Emergency Department (HOSPITAL_BASED_OUTPATIENT_CLINIC_OR_DEPARTMENT_OTHER)
Admission: EM | Admit: 2019-05-18 | Discharge: 2019-05-18 | Disposition: A | Discharge status: Home / Self Care | Payer: BC Managed Care – PPO | Attending: Emergency Medicine | Admitting: Emergency Medicine

## 2019-05-18 ENCOUNTER — Other Ambulatory Visit: Payer: Self-pay

## 2019-05-18 DIAGNOSIS — R0602 Shortness of breath: Secondary | ICD-10-CM | POA: Diagnosis not present

## 2019-05-18 DIAGNOSIS — R059 Cough, unspecified: Secondary | ICD-10-CM

## 2019-05-18 DIAGNOSIS — Z20828 Contact with and (suspected) exposure to other viral communicable diseases: Secondary | ICD-10-CM | POA: Diagnosis not present

## 2019-05-18 DIAGNOSIS — E119 Type 2 diabetes mellitus without complications: Secondary | ICD-10-CM | POA: Diagnosis not present

## 2019-05-18 DIAGNOSIS — Z7984 Long term (current) use of oral hypoglycemic drugs: Secondary | ICD-10-CM | POA: Diagnosis not present

## 2019-05-18 DIAGNOSIS — R05 Cough: Secondary | ICD-10-CM | POA: Diagnosis present

## 2019-05-18 LAB — CBC WITH DIFFERENTIAL/PLATELET
Abs Immature Granulocytes: 0.03 10*3/uL (ref 0.00–0.07)
Basophils Absolute: 0 10*3/uL (ref 0.0–0.1)
Basophils Relative: 0 %
Eosinophils Absolute: 0.5 10*3/uL (ref 0.0–0.5)
Eosinophils Relative: 4 %
HCT: 35.9 % — ABNORMAL LOW (ref 36.0–46.0)
Hemoglobin: 10.4 g/dL — ABNORMAL LOW (ref 12.0–15.0)
Immature Granulocytes: 0 %
Lymphocytes Relative: 15 %
Lymphs Abs: 1.9 10*3/uL (ref 0.7–4.0)
MCH: 22 pg — ABNORMAL LOW (ref 26.0–34.0)
MCHC: 29 g/dL — ABNORMAL LOW (ref 30.0–36.0)
MCV: 76.1 fL — ABNORMAL LOW (ref 80.0–100.0)
Monocytes Absolute: 0.9 10*3/uL (ref 0.1–1.0)
Monocytes Relative: 7 %
Neutro Abs: 9 10*3/uL — ABNORMAL HIGH (ref 1.7–7.7)
Neutrophils Relative %: 74 %
Platelets: 273 10*3/uL (ref 150–400)
RBC: 4.72 MIL/uL (ref 3.87–5.11)
RDW: 17.1 % — ABNORMAL HIGH (ref 11.5–15.5)
WBC: 12.3 10*3/uL — ABNORMAL HIGH (ref 4.0–10.5)
nRBC: 0 % (ref 0.0–0.2)

## 2019-05-18 LAB — BASIC METABOLIC PANEL
Anion gap: 9 (ref 5–15)
BUN: 8 mg/dL (ref 6–20)
CO2: 23 mmol/L (ref 22–32)
Calcium: 9.1 mg/dL (ref 8.9–10.3)
Chloride: 103 mmol/L (ref 98–111)
Creatinine, Ser: 0.67 mg/dL (ref 0.44–1.00)
GFR calc Af Amer: >60 mL/min (ref >60)
GFR calc non Af Amer: >60 mL/min (ref >60)
Glucose, Bld: 211 mg/dL — ABNORMAL HIGH (ref 70–99)
Potassium: 3.8 mmol/L (ref 3.5–5.1)
Sodium: 135 mmol/L (ref 135–145)

## 2019-05-18 LAB — BRAIN NATRIURETIC PEPTIDE: B Natriuretic Peptide: 36 pg/mL (ref 0.0–100.0)

## 2019-05-18 LAB — D-DIMER, QUANTITATIVE: D-Dimer, Quant: 0.48 ug/mL-FEU (ref 0.00–0.50)

## 2019-05-18 MED ORDER — AEROCHAMBER PLUS FLO-VU MEDIUM MISC
1.0000 | Freq: Once | Status: AC
  Administered 2019-05-18: 15:00:00 1
  Filled 2019-05-18: qty 1

## 2019-05-18 MED ORDER — BENZONATATE 100 MG PO CAPS: 100.0000 mg | ORAL_CAPSULE | Freq: Three times a day (TID) | ORAL | 0 refills | Status: AC

## 2019-05-18 MED ORDER — ALBUTEROL SULFATE HFA 108 (90 BASE) MCG/ACT IN AERS
6.0000 | INHALATION_SPRAY | Freq: Once | RESPIRATORY_TRACT | Status: AC
  Administered 2019-05-18: 6 via RESPIRATORY_TRACT
  Filled 2019-05-18: qty 6.7

## 2019-05-18 MED ORDER — FLUTICASONE PROPIONATE 50 MCG/ACT NA SUSP: 1.0000 | Freq: Every day | NASAL | 0 refills | Status: AC

## 2019-05-18 NOTE — ED Notes (Signed)
ED Provider at bedside. 

## 2019-05-18 NOTE — Discharge Instructions (Addendum)
You were seen in the emergency department today for trouble breathing. Your labs were reassuring overall, your anemia is similar to prior labs you have had done, your white blood cell count was a bit elevated which can occur with infection.  Your blood sugar was also high, please have this rechecked by primary care.  Your chest x-ray did not show pneumonia, did show some mild enlargement of your heart which should be rechecked by primary care.  At this time we suspect your symptoms are likely viral in nature and possibly allergic, we are sending you home with Flonase to take 1 spray per nostril daily as needed for congestion, Tessalon to take every 8 hours as needed for coughing, and an albuterol inhaler to take 1 to 2 puffs every 4-6 hours as needed for trouble breathing/wheezing.  We have prescribed you new medication(s) today. Discuss the medications prescribed today with your pharmacist as they can have adverse effects and interactions with your other medicines including over the counter and prescribed medications. Seek medical evaluation if you start to experience new or abnormal symptoms after taking one of these medicines, seek care immediately if you start to experience difficulty breathing, feeling of your throat closing, facial swelling, or rash as these could be indications of a more serious allergic reaction  We have tested you for COVID, we will call you if positive. We are instruction patient's with covid to quarantine themselves for 14 days. You may be able to discontinue self quarantine if the following conditions are met:   Persons with COVID-19 who have symptoms and were directed to care for themselves at home may discontinue home isolation under the  following conditions: - It has been at least 7 days have passed since symptoms first appeared. - AND at least 3 days (72 hours) have passed since recovery defined as resolution of fever without the use of fever-reducing medications and  improvement in respiratory symptoms (e.g., cough, shortness of breath)  Please follow the below quarantine instructions.   Please follow up with primary care within 3-5 days for re-evaluation- call prior to going to the office to make them aware of your symptoms. Return to the ER for new or worsening symptoms including but not limited to increased work of breathing, fever, chest pain, passing out, or any other concerns.       Person Under Monitoring Name: Christina Horton  Location: [address] [place] [address]   Infection Prevention Recommendations for Individuals Confirmed to have, or Being Evaluated for, 2019 Novel Coronavirus (COVID-19) Infection Who Receive Care at Home  Individuals who are confirmed to have, or are being evaluated for, COVID-19 should follow the prevention steps below until a healthcare provider or local or state health department says they can return to normal activities.  Stay home except to get medical care You should restrict activities outside your home, except for getting medical care. Do not go to work, school, or public areas, and do not use public transportation or taxis.  Call ahead before visiting your doctor Before your medical appointment, call the healthcare provider and tell them that you have, or are being evaluated for, COVID-19 infection. This will help the healthcare providers office take steps to keep other people from getting infected. Ask your healthcare provider to call the local or state health department.  Monitor your symptoms Seek prompt medical attention if your illness is worsening (e.g., difficulty breathing). Before going to your medical appointment, call the healthcare provider and tell them  that you have, or are being evaluated for, COVID-19 infection. Ask your healthcare provider to call the local or state health department.  Wear a facemask You should wear a facemask that covers your nose and mouth when you  are in the same room with other people and when you visit a healthcare provider. People who live with or visit you should also wear a facemask while they are in the same room with you.  Separate yourself from other people in your home As much as possible, you should stay in a different room from other people in your home. Also, you should use a separate bathroom, if available.  Avoid sharing household items You should not share dishes, drinking glasses, cups, eating utensils, towels, bedding, or other items with other people in your home. After using these items, you should wash them thoroughly with soap and water.  Cover your coughs and sneezes Cover your mouth and nose with a tissue when you cough or sneeze, or you can cough or sneeze into your sleeve. Throw used tissues in a lined trash can, and immediately wash your hands with soap and water for at least 20 seconds or use an alcohol-based hand rub.  Wash your [facility] your hands often and thoroughly with soap and water for at least 20 seconds. You can use an alcohol-based hand sanitizer if soap and water are not available and if your hands are not visibly dirty. Avoid touching your eyes, nose, and mouth with unwashed hands.   Prevention Steps for Caregivers and Household Members of Individuals Confirmed to have, or Being Evaluated for, COVID-19 Infection Being Cared for in the Home  If you live with, or provide care at home for, a person confirmed to have, or being evaluated for, COVID-19 infection please follow these guidelines to prevent infection:  Follow healthcare providers instructions Make sure that you understand and can help the patient follow any healthcare provider instructions for all care.  Provide for the patients basic needs You should help the patient with basic needs in the home and provide support for getting groceries, prescriptions, and other personal needs.  Monitor the patients symptoms If they are  getting sicker, call his or her medical provider and tell them that the patient has, or is being evaluated for, COVID-19 infection. This will help the healthcare providers office take steps to keep other people from getting infected. Ask the healthcare provider to call the local or state health department.  Limit the number of people who have contact with the patient If possible, have only one caregiver for the patient. Other household members should stay in another home or place of residence. If this is not possible, they should stay in another room, or be separated from the patient as much as possible. Use a separate bathroom, if available. Restrict visitors who do not have an essential need to be in the home.  Keep older adults, very young children, and other sick people away from the patient Keep older adults, very young children, and those who have compromised immune systems or chronic health conditions away from the patient. This includes people with chronic heart, lung, or kidney conditions, diabetes, and cancer.  Ensure good ventilation Make sure that shared spaces in the home have good air flow, such as from an air conditioner or an opened window, weather permitting.  Wash your hands often Wash your hands often and thoroughly with soap and water for at least 20 seconds. You can use an alcohol based  hand sanitizer if soap and water are not available and if your hands are not visibly dirty. Avoid touching your eyes, nose, and mouth with unwashed hands. Use disposable paper towels to dry your hands. If not available, use dedicated cloth towels and replace them when they become wet.  Wear a facemask and gloves Wear a disposable facemask at all times in the room and gloves when you touch or have contact with the patients blood, body fluids, and/or secretions or excretions, such as sweat, saliva, sputum, nasal mucus, vomit, urine, or feces.  Ensure the mask fits over your nose and mouth  tightly, and do not touch it during use. Throw out disposable facemasks and gloves after using them. Do not reuse. Wash your hands immediately after removing your facemask and gloves. If your personal clothing becomes contaminated, carefully remove clothing and launder. Wash your hands after handling contaminated clothing. Place all used disposable facemasks, gloves, and other waste in a lined container before disposing them with other household waste. Remove gloves and wash your hands immediately after handling these items.  Do not share dishes, glasses, or other household items with the patient Avoid sharing household items. You should not share dishes, drinking glasses, cups, eating utensils, towels, bedding, or other items with a patient who is confirmed to have, or being evaluated for, COVID-19 infection. After the person uses these items, you should wash them thoroughly with soap and water.  Wash laundry thoroughly Immediately remove and wash clothes or bedding that have blood, body fluids, and/or secretions or excretions, such as sweat, saliva, sputum, nasal mucus, vomit, urine, or feces, on them. Wear gloves when handling laundry from the patient. Read and follow directions on labels of laundry or clothing items and detergent. In general, wash and dry with the warmest temperatures recommended on the label.  Clean all areas the individual has used often Clean all touchable surfaces, such as counters, tabletops, doorknobs, bathroom fixtures, toilets, phones, keyboards, tablets, and bedside tables, every day. Also, clean any surfaces that may have blood, body fluids, and/or secretions or excretions on them. Wear gloves when cleaning surfaces the patient has come in contact with. Use a diluted bleach solution (e.g., dilute bleach with 1 part bleach and 10 parts water) or a household disinfectant with a label that says EPA-registered for coronaviruses. To make a bleach solution at home, add 1  tablespoon of bleach to 1 quart (4 cups) of water. For a larger supply, add  cup of bleach to 1 gallon (16 cups) of water. Read labels of cleaning products and follow recommendations provided on product labels. Labels contain instructions for safe and effective use of the cleaning product including precautions you should take when applying the product, such as wearing gloves or eye protection and making sure you have good ventilation during use of the product. Remove gloves and wash hands immediately after cleaning.  Monitor yourself for signs and symptoms of illness Caregivers and household members are considered close contacts, should monitor their health, and will be asked to limit movement outside of the home to the extent possible. Follow the monitoring steps for close contacts listed on the symptom monitoring form.   ? If you have additional questions, contact your local health department or call the epidemiologist on call at [phone] (available 24/7). ? This guidance is subject to change. For the most up-to-date guidance from [facility], please refer to their website: [email or web address]

## 2019-05-18 NOTE — ED Provider Notes (Signed)
[facility] EMERGENCY DEPARTMENT Provider Note   CSN: [id number] Arrival date & time: 05/18/19  1301     History   Chief Complaint Chief Complaint  Patient presents with  . Cough    HPI Christina Horton is a 53 y.o. female with a hx of DM, bronchitis, appendectomy, & tubal ligation who presents to the emergency department with complaints of cough x 3 days and dyspnea since yesterday.  Patient states that she has had congestion, sinus pressure, sore throat, ear discomfort, loss of taste, and a dry cough for the past few days.  States she feels she is wheezing at times.  Yesterday she started to feel if she was having trouble breathing with coughing spells throughout the day and then was having more trouble breathing at night when trying to sleep.  No other alleviating or aggravating factors.  Tried taking Vics without much change.  Denies fever, chills, nausea, vomiting, diarrhea, abdominal pain, or chest pain. Denies leg pain/swelling, hemoptysis, recent surgery/trauma, recent long travel, hormone use, personal hx of cancer, or hx of DVT/PE.  She states her son's girlfriend recently was sick with similar sxs- tested negative for covid, no other sick contacts.  She has had bronchitis previously and this feels somewhat similar.     HPI  Past Medical History:  Diagnosis Date  . Bronchitis   . Diabetes mellitus without complication (HCC)     There are no active problems to display for this patient.   Past Surgical History:  Procedure Laterality Date  . ABDOMINAL SURGERY    . APPENDECTOMY    . TUBAL LIGATION       OB History   No obstetric history on file.      Home Medications    Prior to Admission medications   Medication Sig Start Date End Date Taking? Authorizing Provider  meclizine (ANTIVERT) 25 MG tablet Take 1 tablet (25 mg total) by mouth 3 (three) times daily as needed for dizziness. 03/01/19   Sabas Sous, MD  metFORMIN (GLUCOPHAGE) 500 MG tablet  Take by mouth 2 (two) times daily with a meal.    [provider]    Family History Family History  Problem Relation Age of Onset  . Cancer Sister     Social History Social History   Tobacco Use  . Smoking status: Former Games developer  . Smokeless tobacco: Never Used  Substance Use Topics  . Alcohol use: Yes    Comment: socially  . Drug use: No     Allergies   Patient has no known allergies.   Review of Systems Review of Systems  Constitutional: Negative for chills and fever.  HENT: Positive for congestion, ear pain, sinus pressure and sore throat.   Respiratory: Positive for cough, shortness of breath and wheezing.   Cardiovascular: Negative for chest pain and leg swelling.  Gastrointestinal: Negative for abdominal pain, diarrhea, nausea and vomiting.  Musculoskeletal: Negative for myalgias.  Neurological: Negative for syncope.  All other systems reviewed and are negative.  Physical Exam Updated Vital Signs BP 130/86   Pulse 89   Temp 98 F (36.7 C) (Oral)   Resp (!) 21   Ht 5\' 2"  (1.575 m)   Wt 106.6 kg   LMP 04/24/2019   SpO2 94%   BMI 42.98 kg/m   Physical Exam Vitals signs and nursing note reviewed.  Constitutional:      General: She is not in acute distress.    Appearance: She is well-developed.  HENT:  Head: Normocephalic and atraumatic.     Right Ear: Ear canal normal. Tympanic membrane is not perforated, erythematous, retracted or bulging.     Left Ear: Ear canal normal. Tympanic membrane is not perforated, erythematous, retracted or bulging.     Ears:     Comments: No mastoid erythema/swelling/tenderness.     Nose: Congestion present.     Right Sinus: Maxillary sinus tenderness present. No frontal sinus tenderness.     Left Sinus: Maxillary sinus tenderness present. No frontal sinus tenderness.     Mouth/Throat:     Pharynx: Uvula midline. No oropharyngeal exudate or posterior oropharyngeal erythema.     Comments: Posterior oropharynx  is symmetric appearing. Patient tolerating own secretions without difficulty. No trismus. No drooling. No hot potato voice. No swelling beneath the tongue, submandibular compartment is soft.  Eyes:     General:        Right eye: No discharge.        Left eye: No discharge.     Conjunctiva/sclera: Conjunctivae normal.     Pupils: Pupils are equal, round, and reactive to light.  Neck:     Musculoskeletal: Normal range of motion and neck supple. No edema or neck rigidity.  Cardiovascular:     Rate and Rhythm: Normal rate and regular rhythm.     Heart sounds: No murmur.  Pulmonary:     Effort: Pulmonary effort is normal. No respiratory distress.     Breath sounds: Wheezing (Occasional end expiratory scattered) present. No rhonchi or rales.     Comments: SPO2 96 to 98% on room air at rest, I personally had the patient ambulate throughout her exam room and she maintained her SpO2 @ > 95%  Abdominal:     General: There is no distension.     Palpations: Abdomen is soft.     Tenderness: There is no abdominal tenderness.  Musculoskeletal:     Comments: No lower extremity edema or tenderness to palpation.  Lymphadenopathy:     Cervical: No cervical adenopathy.  Skin:    General: Skin is warm and dry.     Findings: No rash.  Neurological:     Mental Status: She is alert.  Psychiatric:        Behavior: Behavior normal.    ED Treatments / Results  Labs (all labs ordered are listed, but only abnormal results are displayed) Labs Reviewed  CBC WITH DIFFERENTIAL/PLATELET - Abnormal; Notable for the following components:      Result Value   WBC 12.3 (*)    Hemoglobin 10.4 (*)    HCT 35.9 (*)    MCV 76.1 (*)    MCH 22.0 (*)    MCHC 29.0 (*)    RDW 17.1 (*)    Neutro Abs 9.0 (*)    All other components within normal limits  BASIC METABOLIC PANEL - Abnormal; Notable for the following components:   Glucose, Bld 211 (*)    All other components within normal limits  NOVEL CORONAVIRUS, NAA  (HOSP ORDER, SEND-OUT TO REF LAB; TAT 18-24 HRS)  D-DIMER, QUANTITATIVE (NOT AT [facility])  BRAIN NATRIURETIC PEPTIDE    EKG None  Radiology Dg Chest Port 1 View  Result Date: 05/18/2019 CLINICAL DATA:  Pt c/o flu like sx day-also states she had to sleep sitting up last night due to feeling SOB when she lies down-positive loss of taste today. LMP x 04/24/2019 HX: Diabetes, Former smoker, Bronchitis. EXAM: PORTABLE CHEST 1 VIEW COMPARISON:  Chest radiograph 06/20/2018 FINDINGS:  Stable cardiomediastinal contours with enlarged heart size. The lungs are clear. No pneumothorax or large pleural effusion. No acute findings in the visualized skeleton. IMPRESSION: No evidence of active disease.  Cardiomegaly. Electronically Signed   By: Emmaline KluverNancy  Ballantyne M.D.   On: 05/18/2019 15:58    Procedures Procedures (including critical care time)  Medications Ordered in ED Medications - No data to display   Initial Impression / Assessment and Plan / ED Course  I have reviewed the triage vital signs and the nursing notes.  Pertinent labs & imaging results that were available during my care of the patient were reviewed by me and considered in my medical decision making (see chart for details).   Patient presents to the emergency department with URI symptoms, cough, and dyspnea.  Patient is nontoxic-appearing, no apparent distress, initial tachycardia normalized on my exam.  Afebrile, < 7 days of sxs, doubt acute bacterial sinusitis requiring antibiotics.  Centor 0, doubt strep. TMs clear. No nuchal rigidity.  No respiratory distress, end expiratory wheezing which is scattered intermittently throughout.  Plan for albuterol inhaler and chest x-ray.  Chest x-ray with no active disease, cardiomegaly noted. Labs added on: CBC: Leukocytosis that is mild and felt to be nonspecific.  Anemia which is similar to prior. BMP: Hyperglycemia without acidosis or anion gap elevation D-dimer: WNL BNP: WNL  Low risk Wells,  d-dimer WNL, doubt PE. Cardiomegaly on CXR, BNP WNL, no pulmonary edema- does not seem consistent w/ new onset CHF.  No chest pain.  No respiratory distress, ambulatory SPO2 maintaining greater than 95%. Suspect viral vs. Allergic. COVID testing sent. Will discharge home with inhaler, flonase, & tessalon w/ PCP follow up. Discussed need for quarantine & hand hygiene. I discussed results, treatment plan, need for follow-up, and return precautions with the patient. Provided opportunity for questions, patient confirmed understanding and is in agreement with plan.    Christina ShellingJeanette Yarberry was evaluated in Emergency Department on 05/18/2019 for the symptoms described in the history of present illness. He/she was evaluated in the context of the global COVID-19 pandemic, which necessitated consideration that the patient might be at risk for infection with the SARS-CoV-2 virus that causes COVID-19. Institutional protocols and algorithms that pertain to the evaluation of patients at risk for COVID-19 are in a state of rapid change based on information released by regulatory bodies including the CDC and federal and state organizations. These policies and algorithms were followed during the patient's care in the ED.  Findings and plan of care discussed with supervising physician Dr. Jacqulyn BathLong who is in agreement.    Final Clinical Impressions(s) / ED Diagnoses   Final diagnoses:  Cough  Shortness of breath    ED Discharge Orders         Ordered    fluticasone (FLONASE) 50 MCG/ACT nasal spray  Daily     05/18/19 1749    benzonatate (TESSALON) 100 MG capsule  Every 8 hours     05/18/19 1749           Cherly Andersonetrucelli, Johnathan Tortorelli R, PA-C 05/18/19 1751    Maia PlanLong, Joshua G, MD 05/20/19 1143

## 2019-05-18 NOTE — ED Notes (Signed)
X-ray at bedside

## 2019-05-18 NOTE — ED Triage Notes (Addendum)
Pt c/o flu like sx day-also states she had to sleep sitting up last night due to feeling SOB when she lies down-positive loss of taste today-NAD-steady gait

## 2019-05-19 LAB — NOVEL CORONAVIRUS, NAA (HOSP ORDER, SEND-OUT TO REF LAB; TAT 18-24 HRS): SARS-CoV-2, NAA: NOT DETECTED

## 2019-09-12 ENCOUNTER — Encounter (HOSPITAL_BASED_OUTPATIENT_CLINIC_OR_DEPARTMENT_OTHER): Payer: Self-pay

## 2019-09-12 ENCOUNTER — Emergency Department (HOSPITAL_BASED_OUTPATIENT_CLINIC_OR_DEPARTMENT_OTHER): Payer: BC Managed Care – PPO

## 2019-09-12 ENCOUNTER — Emergency Department (HOSPITAL_BASED_OUTPATIENT_CLINIC_OR_DEPARTMENT_OTHER)
Admission: EM | Admit: 2019-09-12 | Discharge: 2019-09-12 | Disposition: A | Discharge status: Home / Self Care | Payer: BC Managed Care – PPO | Attending: Emergency Medicine | Admitting: Emergency Medicine

## 2019-09-12 ENCOUNTER — Other Ambulatory Visit: Payer: Self-pay

## 2019-09-12 DIAGNOSIS — Y999 Unspecified external cause status: Secondary | ICD-10-CM | POA: Diagnosis not present

## 2019-09-12 DIAGNOSIS — Z87891 Personal history of nicotine dependence: Secondary | ICD-10-CM | POA: Insufficient documentation

## 2019-09-12 DIAGNOSIS — X58XXXA Exposure to other specified factors, initial encounter: Secondary | ICD-10-CM | POA: Diagnosis not present

## 2019-09-12 DIAGNOSIS — Y9389 Activity, other specified: Secondary | ICD-10-CM | POA: Insufficient documentation

## 2019-09-12 DIAGNOSIS — S6992XA Unspecified injury of left wrist, hand and finger(s), initial encounter: Secondary | ICD-10-CM | POA: Diagnosis present

## 2019-09-12 DIAGNOSIS — Y929 Unspecified place or not applicable: Secondary | ICD-10-CM | POA: Diagnosis not present

## 2019-09-12 DIAGNOSIS — E119 Type 2 diabetes mellitus without complications: Secondary | ICD-10-CM | POA: Diagnosis not present

## 2019-09-12 NOTE — ED Triage Notes (Addendum)
Pt c/o pain,swelling to left thumb after pushing tabs to close a window in her home yesterday-denies direct injury-NAD-steady gait

## 2019-09-12 NOTE — ED Notes (Signed)
ED Provider at bedside. 

## 2019-09-12 NOTE — ED Provider Notes (Signed)
Emergency Department Provider Note   I have reviewed the triage vital signs and the nursing notes.   HISTORY  Chief Complaint Finger Injury   HPI Christina Horton is a 54 y.o. female with PMH of DM presents to the emergency department for evaluation of left thumb pain with some bruising after attempting to open a window which was stuck.  She states she was trying to pull the tabs on the window back so that she could open it and was having a lot of difficulty.  She noticed some pain in the thumb along with some bruising after washing her hands.  The bruising is at the base of the left thumb and she has pain with flexing the thumb back toward her palm.  She denies any numbness.  No lacerations.  Denies pain in the wrist or forearm.  No injury to other fingers.  Past Medical History:  Diagnosis Date  . Bronchitis   . Diabetes mellitus without complication (HCC)     There are no problems to display for this patient.   Past Surgical History:  Procedure Laterality Date  . ABDOMINAL SURGERY    . APPENDECTOMY    . TUBAL LIGATION      Allergies Patient has no known allergies.  Family History  Problem Relation Age of Onset  . Cancer Sister     Social History Social History   Tobacco Use  . Smoking status: Former Games developer  . Smokeless tobacco: Never Used  Substance Use Topics  . Alcohol use: Yes    Comment: socially  . Drug use: No    Review of Systems  Musculoskeletal: Positive left thumb pain.  Skin: Positive bruising.  Neurological: Negative for numbness.  ____________________________________________   PHYSICAL EXAM:  VITAL SIGNS: ED Triage Vitals  Enc Vitals Group     BP 09/12/19 1139 (!) 162/90     Pulse Rate 09/12/19 1139 95     Resp 09/12/19 1139 20     Temp 09/12/19 1139 97.7 F (36.5 C)     Temp Source 09/12/19 1139 Oral     SpO2 09/12/19 1139 98 %     Weight 09/12/19 1139 236 lb (107 kg)     Height 09/12/19 1139 5\' 2"  (1.575 m)    Constitutional: Alert and oriented. Well appearing and in no acute distress. Eyes: Conjunctivae are normal.  Head: Atraumatic. Nose: No congestion/rhinnorhea. Neck: No stridor.  Cardiovascular: Good peripheral circulation (capillary refill).  Respiratory: Normal respiratory effort.  Gastrointestinal: No distention.  Musculoskeletal: Pain at the base of the left thumb with mild bruising medially.  Limited range of motion with flexion of the left thumb.  No deformity.  No tenderness over the wrist.  Neurologic:  Normal left thumb and thenar eminence sensation.  Skin:  Skin is warm and dry. Bruising to the medial left thumb. No laceration.    ____________________________________________  RADIOLOGY  Left thumb plain film reviewed.  ____________________________________________   PROCEDURES  Procedure(s) performed:   Procedures  None ____________________________________________   INITIAL IMPRESSION / ASSESSMENT AND PLAN / ED COURSE  Pertinent labs & imaging results that were available during my care of the patient were reviewed by me and considered in my medical decision making (see chart for details).   Patient with mild bruising and tenderness to the medial left thumb.  No deformity.  Lower suspicion clinically for fracture but suspect possible ligament strain.  Patient with some strength with flexion of the left thumb so doubt full tendon tear  or avulsion.  Normal sensation over the thumb.  Sending for plain film and plan for thumb spica with sports medicine follow-up in several days off of work for rest.   Plain film negative. Plan for f/u with sports med.  ____________________________________________  FINAL CLINICAL IMPRESSION(S) / ED DIAGNOSES  Final diagnoses:  Injury of finger of left hand, initial encounter    Note:  This document was prepared using Dragon voice recognition software and may include unintentional dictation errors.  Nanda Quinton, MD, [facility] Emergency  Medicine    Khaden Gater, [facility], MD 09/13/19 Karl Bales

## 2019-09-12 NOTE — Discharge Instructions (Signed)
You were seen in the emergency department today with injury to the left thumb.  Your x-ray did not show fracture.  Please use the thumb spica splint as needed for discomfort and take Tylenol and/or Motrin as instructed on the box for pain.  You can apply ice today and then move to heat moving forward.  Please contact the sports medicine doctor listed for follow-up appointment. I have provided a work note as well.

## 2019-09-12 NOTE — ED Notes (Signed)
Pt taken to radiology

## 2019-12-30 IMAGING — DX DG CHEST 1V PORT
1 series · 1 of 1 positions shown · non-contrast
Comparison: Chest radiograph 06/20/2018

CLINICAL DATA: Pt c/o flu like sx day-also states she had to sleep
loss of taste today. LMP x 04/24/2019 HX: Diabetes, Former smoker,
Bronchitis.

EXAM:
PORTABLE CHEST 1 VIEW

[chest ap]
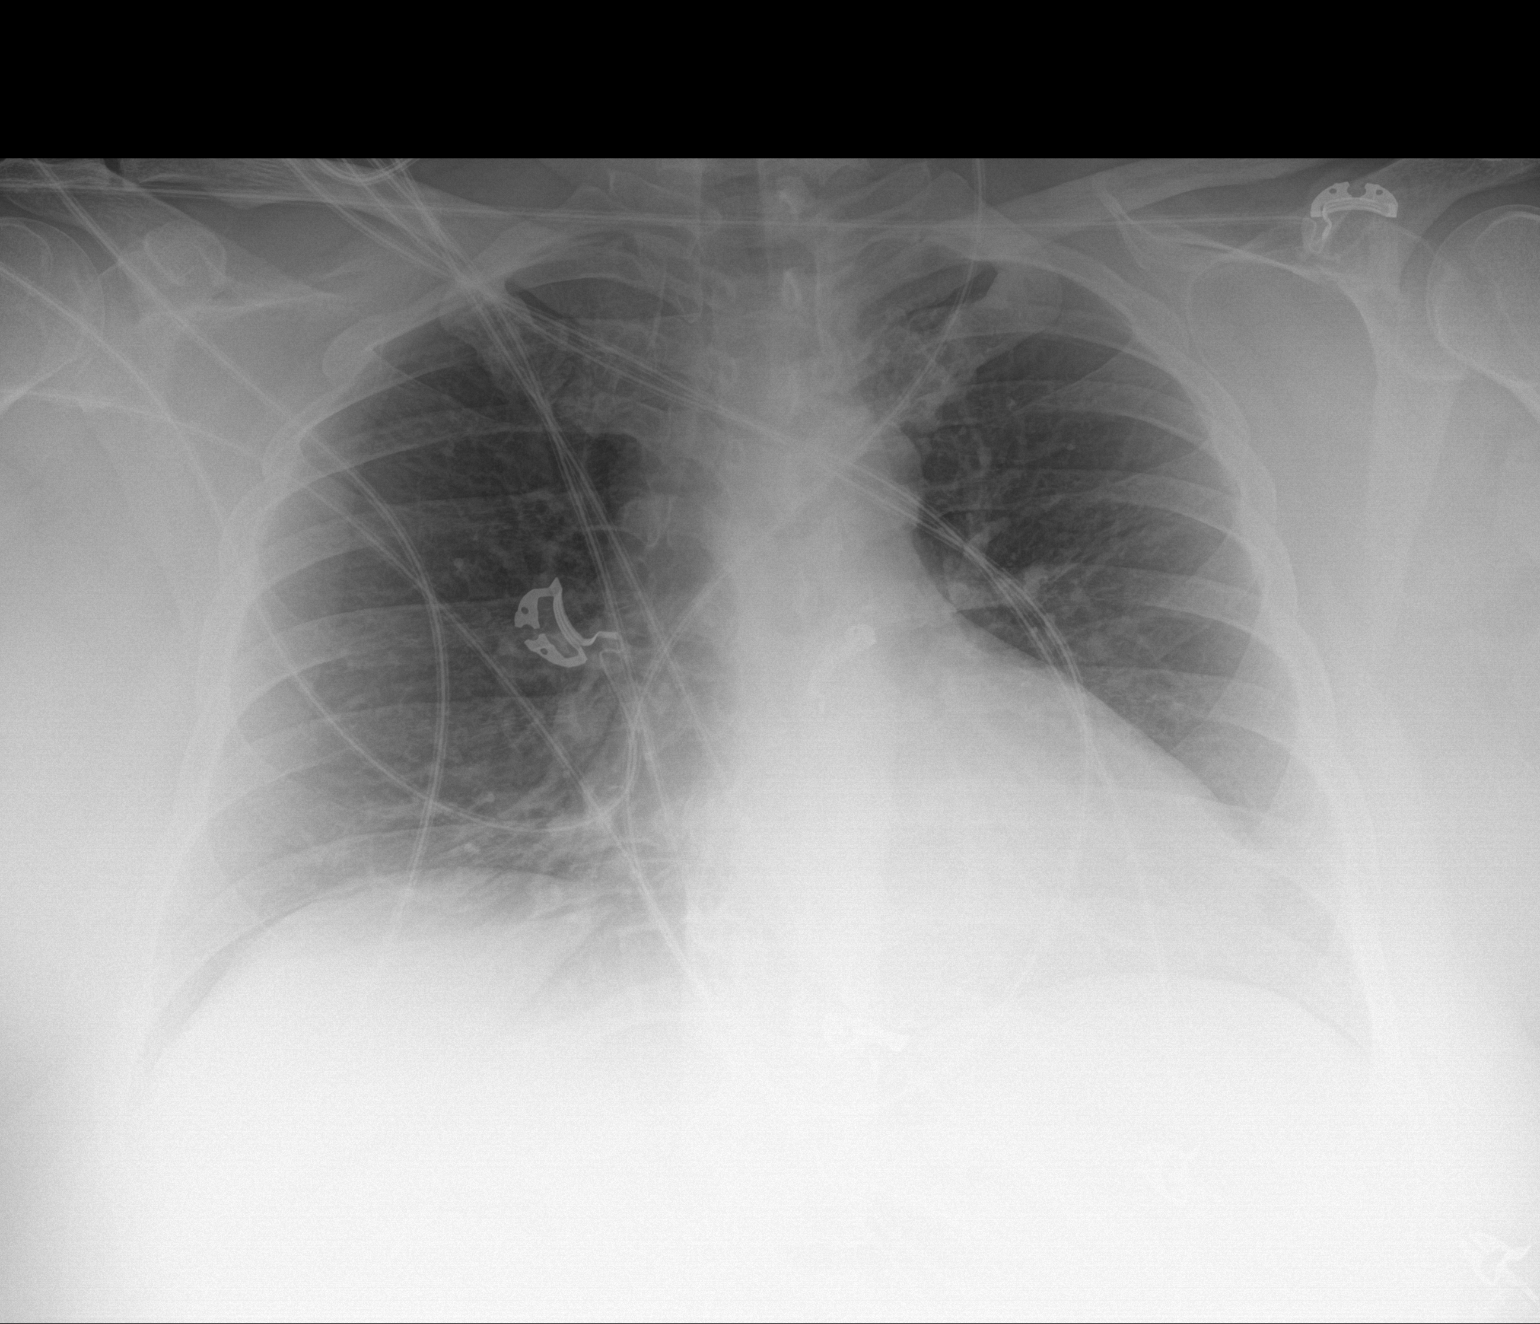

[1 of 1 positions shown; findings below may reference images not displayed]

FINDINGS: Stable cardiomediastinal contours with enlarged heart size. The
lungs are clear. No pneumothorax or large pleural effusion. No acute
findings in the visualized skeleton.
IMPRESSION: No evidence of active disease.  Cardiomegaly.

## 2022-07-23 ENCOUNTER — Encounter (HOSPITAL_BASED_OUTPATIENT_CLINIC_OR_DEPARTMENT_OTHER): Payer: Self-pay

## 2022-07-23 ENCOUNTER — Emergency Department (HOSPITAL_BASED_OUTPATIENT_CLINIC_OR_DEPARTMENT_OTHER): Payer: BC Managed Care – PPO

## 2022-07-23 ENCOUNTER — Other Ambulatory Visit: Payer: Self-pay

## 2022-07-23 ENCOUNTER — Emergency Department (HOSPITAL_BASED_OUTPATIENT_CLINIC_OR_DEPARTMENT_OTHER)
Admission: EM | Admit: 2022-07-23 | Discharge: 2022-07-23 | Disposition: A | Discharge status: Home / Self Care | Payer: BC Managed Care – PPO | Attending: Emergency Medicine | Admitting: Emergency Medicine

## 2022-07-23 DIAGNOSIS — R1011 Right upper quadrant pain: Secondary | ICD-10-CM | POA: Insufficient documentation

## 2022-07-23 LAB — URINALYSIS, ROUTINE W REFLEX MICROSCOPIC
Bilirubin Urine: NEGATIVE
Glucose, UA: NEGATIVE mg/dL
Hgb urine dipstick: NEGATIVE
Ketones, ur: NEGATIVE mg/dL
Leukocytes,Ua: NEGATIVE
Nitrite: NEGATIVE
Protein, ur: NEGATIVE mg/dL
Specific Gravity, Urine: 1.025 (ref 1.005–1.030)
pH: 6 (ref 5.0–8.0)

## 2022-07-23 LAB — CBC WITH DIFFERENTIAL/PLATELET
Abs Immature Granulocytes: 0.01 10*3/uL (ref 0.00–0.07)
Basophils Absolute: 0 10*3/uL (ref 0.0–0.1)
Basophils Relative: 0 %
Eosinophils Absolute: 0.2 10*3/uL (ref 0.0–0.5)
Eosinophils Relative: 2 %
HCT: 42.3 % (ref 36.0–46.0)
Hemoglobin: 14 g/dL (ref 12.0–15.0)
Immature Granulocytes: 0 %
Lymphocytes Relative: 28 %
Lymphs Abs: 1.9 10*3/uL (ref 0.7–4.0)
MCH: 28.5 pg (ref 26.0–34.0)
MCHC: 33.1 g/dL (ref 30.0–36.0)
MCV: 86.2 fL (ref 80.0–100.0)
Monocytes Absolute: 0.6 10*3/uL (ref 0.1–1.0)
Monocytes Relative: 8 %
Neutro Abs: 4 10*3/uL (ref 1.7–7.7)
Neutrophils Relative %: 62 %
Platelets: 271 10*3/uL (ref 150–400)
RBC: 4.91 MIL/uL (ref 3.87–5.11)
RDW: 12.5 % (ref 11.5–15.5)
WBC: 6.7 10*3/uL (ref 4.0–10.5)
nRBC: 0 % (ref 0.0–0.2)

## 2022-07-23 LAB — COMPREHENSIVE METABOLIC PANEL
ALT: 20 U/L (ref 0–44)
AST: 18 U/L (ref 15–41)
Albumin: 4.2 g/dL (ref 3.5–5.0)
Alkaline Phosphatase: 90 U/L (ref 38–126)
Anion gap: 6 (ref 5–15)
BUN: 13 mg/dL (ref 6–20)
CO2: 27 mmol/L (ref 22–32)
Calcium: 9.9 mg/dL (ref 8.9–10.3)
Chloride: 105 mmol/L (ref 98–111)
Creatinine, Ser: 0.62 mg/dL (ref 0.44–1.00)
GFR, Estimated: >60 mL/min (ref >60)
Glucose, Bld: 127 mg/dL — ABNORMAL HIGH (ref 70–99)
Potassium: 4 mmol/L (ref 3.5–5.1)
Sodium: 138 mmol/L (ref 135–145)
Total Bilirubin: 0.4 mg/dL (ref 0.3–1.2)
Total Protein: 7.7 g/dL (ref 6.5–8.1)

## 2022-07-23 LAB — LIPASE, BLOOD: Lipase: 35 U/L (ref 11–51)

## 2022-07-23 NOTE — ED Notes (Signed)
ED Provider at bedside. 

## 2022-07-23 NOTE — Discharge Instructions (Signed)
It was a pleasure taking care of you today!  Your labs were unremarkable today.  Your ultrasound did not show any concerning findings today.  Call your GI doctor to set up a closer follow-up appointment regarding your ED visit.  Ensure to maintain fluid intake.  Return to the emergency department if you are experiencing increasing/worsening symptoms.

## 2022-07-23 NOTE — ED Triage Notes (Signed)
Pt reports pain right upper abd x one month. Pt reports increased since Wednesday. No BM x 4 days and is nauseated

## 2022-07-23 NOTE — ED Provider Notes (Signed)
[facility] EMERGENCY DEPARTMENT Provider Note   CSN: [id number] Arrival date & time: 07/23/22  1045     History  Chief Complaint  Patient presents with   Abdominal Pain    Christina Horton is a 56 y.o. female who presents emergency department concerns for intermittent right upper quadrant abdominal pain onset 1 month.  Has been evaluated for her symptoms in multiple ED's.  Notes that her pain has increased as her most recent ED visit.  Has associated nausea.  Has tried tramadol and Tylenol at home for her symptoms.  Does not have an appendix.  Was given information for GI specialist with follow-up on September 03, 2022.  Denies vomiting, fever, urinary symptoms.  The history is provided by the patient. No language interpreter was used.       Home Medications Prior to Admission medications   Medication Sig Start Date End Date Taking? Authorizing Provider  benzonatate (TESSALON) 100 MG capsule Take 1 capsule (100 mg total) by mouth every 8 (eight) hours. 05/18/19   Petrucelli, Samantha R, PA-C  fluticasone (FLONASE) 50 MCG/ACT nasal spray Place 1 spray into both nostrils daily. 05/18/19   Petrucelli, Pleas Koch, PA-C  meclizine (ANTIVERT) 25 MG tablet Take 1 tablet (25 mg total) by mouth 3 (three) times daily as needed for dizziness. 03/01/19   Sabas Sous, MD  metFORMIN (GLUCOPHAGE) 500 MG tablet Take by mouth 2 (two) times daily with a meal.    [provider]      Allergies    Patient has no known allergies.    Review of Systems   Review of Systems  Gastrointestinal:  Positive for abdominal pain.  All other systems reviewed and are negative.   Physical Exam Updated Vital Signs BP (!) 163/90 (BP Location: Left Arm)   Pulse 74   Temp 97.9 F (36.6 C) (Oral)   Resp 17   Ht 5\' 2"  (1.575 m)   Wt 97.1 kg   LMP 08/29/2019   SpO2 99%   BMI 39.14 kg/m  Physical Exam Vitals and nursing note reviewed.  Constitutional:      General: She is not in  acute distress.    Appearance: She is not diaphoretic.  HENT:     Head: Normocephalic and atraumatic.     Mouth/Throat:     Pharynx: No oropharyngeal exudate.  Eyes:     General: No scleral icterus.    Conjunctiva/sclera: Conjunctivae normal.  Cardiovascular:     Rate and Rhythm: Normal rate and regular rhythm.     Pulses: Normal pulses.     Heart sounds: Normal heart sounds.  Pulmonary:     Effort: Pulmonary effort is normal. No respiratory distress.     Breath sounds: Normal breath sounds. No wheezing.  Abdominal:     General: Bowel sounds are normal.     Palpations: Abdomen is soft. There is no mass.     Tenderness: There is abdominal tenderness in the right upper quadrant. There is no guarding or rebound.     Comments: TTP to RUQ  Musculoskeletal:        General: Normal range of motion.     Cervical back: Normal range of motion and neck supple.  Skin:    General: Skin is warm and dry.  Neurological:     Mental Status: She is alert.  Psychiatric:        Behavior: Behavior normal.     ED Results / Procedures / Treatments   Labs (  all labs ordered are listed, but only abnormal results are displayed) Labs Reviewed  COMPREHENSIVE METABOLIC PANEL - Abnormal; Notable for the following components:      Result Value   Glucose, Bld 127 (*)    All other components within normal limits  CBC WITH DIFFERENTIAL/PLATELET  LIPASE, BLOOD  URINALYSIS, ROUTINE W REFLEX MICROSCOPIC    EKG None  Radiology US Abdomen Limited RUQ (LIVER/GB)  Result Date: 07/23/2022 CLINICAL DATA:  RUQ Pain EXAM: ULTRASOUND ABDOMEN LIMITED RIGHT UPPER QUADRANT COMPARISON:  CT AP 07/21/22 FINDINGS: Gallbladder: No gallstones or wall thickening visualized. The gallbladder is partially decompressed. No sonographic Murphy sign noted by sonographer. Common bile duct: Diameter: 0.4 cm, which is normal Liver: No focal lesion identified. Slightly increased parenchymal echogenicity. Portal vein is patent on  color Doppler imaging with normal direction of blood flow towards the liver. Other: None. IMPRESSION: Gallbladder is contracted without evidence of cholelithiasis, gallbladder wall thickening, or pericholecystic fluid. Per sonographer, sonographic Murphy sign was positive, which is nonspecific in the absence of other findings to suggest cholecystitis. Electronically Signed   By: Lorenza Cambridge M.D.   On: 07/23/2022 12:54    Procedures Procedures    Medications Ordered in ED Medications - No data to display  ED Course/ Medical Decision Making/ A&P Clinical Course as of 07/23/22 1736  Fri Jul 23, 2022  1301 Discussed with patient ultrasound findings.  Discussed with patient discharge treatment plan consisting of maintaining her follow-up appointment with her GI specialist.  Offered to provide patient with pain medication prior to discharge, patient declines at this time.  Patient appears safe for discharge at this time. [SB]    Clinical Course User Index [SB] Braydin Aloi A, PA-C                           Medical Decision Making Amount and/or Complexity of Data Reviewed Labs: ordered. Radiology: ordered.   Patient presents to the emergency department with intermittent right upper quadrant abdominal pain onset 1 month.  Has tried tramadol at home for symptoms.  Patient afebrile.  On exam patient with right upper quadrant tenderness to palpation. Remainder of exam without acute findings. Differential diagnosis includes pancreatitis, cholecystitis, appendicitis, acute  Additional history obtained:  External records from outside source obtained and reviewed including: Patient was seen at [facility] on 07/21/2022 for similar symptoms.  At that time had a negative workup.  Labs:  I ordered, and personally interpreted labs.  The pertinent results include:  Lipase, CBC, CMP, urinalysis unremarkable  Imaging: I ordered imaging studies including right upper quadrant ultrasound I  independently visualized and interpreted imaging which showed negative for cholecystitis I agree with the radiologist interpretation  Disposition: Presenting suspicious for likely right upper quadrant pain.  Doubt cholecystitis, pancreatitis, appendicitis, acute cystitis at this time.  After consideration of the diagnostic results and the patients response to treatment, I feel that the patient would benefit from Discharge home.  Patient has tramadol at home.  Patient also has a follow-up appointment with her GI specialist in January.  Discussed with patient to call her GI specialist regarding today's ED visit. Supportive care measures and strict return precautions discussed with patient at bedside. Pt acknowledges and verbalizes understanding. Pt appears safe for discharge. Follow up as indicated in discharge paperwork.   This chart was dictated using voice recognition software, Dragon. Despite the best efforts of this provider to proofread and correct errors, errors may still  occur which can change documentation meaning.   Final Clinical Impression(s) / ED Diagnoses Final diagnoses:  Right upper quadrant abdominal pain    Rx / DC Orders ED Discharge Orders     None         Zoria Rawlinson A, PA-C 07/23/22 1738    Virgina Norfolk, DO 07/23/22 2216
